# Patient Record
Sex: Female | Born: 1995 | Race: Black or African American | Hispanic: No | Marital: Single | Smoking: Never smoker
Health system: Southern US, Community
[De-identification: ages and names within clinical notes are randomized; demographics above are authoritative.]

## PROBLEM LIST (undated history)

## (undated) ENCOUNTER — Inpatient Hospital Stay (HOSPITAL_COMMUNITY): Payer: Self-pay

## (undated) DIAGNOSIS — Z789 Other specified health status: Secondary | ICD-10-CM

## (undated) HISTORY — PX: NO PAST SURGERIES: SHX2092

---

## 2018-06-08 ENCOUNTER — Encounter (HOSPITAL_COMMUNITY): Payer: Self-pay | Admitting: Emergency Medicine

## 2018-06-08 ENCOUNTER — Emergency Department (HOSPITAL_COMMUNITY)
Admission: EM | Admit: 2018-06-08 | Discharge: 2018-06-08 | Disposition: A | Discharge status: Home / Self Care | Payer: BLUE CROSS/BLUE SHIELD | Attending: Emergency Medicine | Admitting: Emergency Medicine

## 2018-06-08 ENCOUNTER — Other Ambulatory Visit: Payer: Self-pay

## 2018-06-08 DIAGNOSIS — R112 Nausea with vomiting, unspecified: Secondary | ICD-10-CM | POA: Diagnosis present

## 2018-06-08 DIAGNOSIS — O219 Vomiting of pregnancy, unspecified: Secondary | ICD-10-CM | POA: Diagnosis not present

## 2018-06-08 DIAGNOSIS — Z3A00 Weeks of gestation of pregnancy not specified: Secondary | ICD-10-CM | POA: Diagnosis not present

## 2018-06-08 DIAGNOSIS — Z331 Pregnant state, incidental: Secondary | ICD-10-CM

## 2018-06-08 LAB — COMPREHENSIVE METABOLIC PANEL
ALBUMIN: 4.2 g/dL (ref 3.5–5.0)
ALT: 19 U/L (ref 0–44)
ANION GAP: 9 (ref 5–15)
AST: 18 U/L (ref 15–41)
Alkaline Phosphatase: 64 U/L (ref 38–126)
BUN: 8 mg/dL (ref 6–20)
CO2: 25 mmol/L (ref 22–32)
Calcium: 9.5 mg/dL (ref 8.9–10.3)
Chloride: 104 mmol/L (ref 98–111)
Creatinine, Ser: 0.85 mg/dL (ref 0.44–1.00)
GFR calc non Af Amer: >60 mL/min (ref >60)
GLUCOSE: 91 mg/dL (ref 70–99)
POTASSIUM: 4 mmol/L (ref 3.5–5.1)
SODIUM: 138 mmol/L (ref 135–145)
TOTAL PROTEIN: 7.8 g/dL (ref 6.5–8.1)
Total Bilirubin: 0.5 mg/dL (ref 0.3–1.2)

## 2018-06-08 LAB — I-STAT BETA HCG BLOOD, ED (MC, WL, AP ONLY): I-stat hCG, quantitative: >2000 m[IU]/mL — ABNORMAL HIGH (ref <5)

## 2018-06-08 LAB — URINALYSIS, ROUTINE W REFLEX MICROSCOPIC
Bilirubin Urine: NEGATIVE
Glucose, UA: NEGATIVE mg/dL
Hgb urine dipstick: NEGATIVE
KETONES UR: 20 mg/dL — AB
LEUKOCYTES UA: NEGATIVE
Nitrite: NEGATIVE
PH: 6 (ref 5.0–8.0)
Protein, ur: NEGATIVE mg/dL
SPECIFIC GRAVITY, URINE: 1.024 (ref 1.005–1.030)

## 2018-06-08 LAB — CBC WITH DIFFERENTIAL/PLATELET
BASOS ABS: 0 10*3/uL (ref 0.0–0.1)
Basophils Relative: 0 %
Eosinophils Absolute: 0.1 10*3/uL (ref 0.0–0.7)
Eosinophils Relative: 0 %
HEMATOCRIT: 41.1 % (ref 36.0–46.0)
HEMOGLOBIN: 13.3 g/dL (ref 12.0–15.0)
Lymphocytes Relative: 18 %
Lymphs Abs: 2.1 10*3/uL (ref 0.7–4.0)
MCH: 26.9 pg (ref 26.0–34.0)
MCHC: 32.4 g/dL (ref 30.0–36.0)
MCV: 83.2 fL (ref 78.0–100.0)
MONO ABS: 0.5 10*3/uL (ref 0.1–1.0)
Monocytes Relative: 4 %
NEUTROS ABS: 8.9 10*3/uL — AB (ref 1.7–7.7)
NEUTROS PCT: 78 %
Platelets: 302 10*3/uL (ref 150–400)
RBC: 4.94 MIL/uL (ref 3.87–5.11)
RDW: 13.3 % (ref 11.5–15.5)
WBC: 11.6 10*3/uL — ABNORMAL HIGH (ref 4.0–10.5)

## 2018-06-08 LAB — LIPASE, BLOOD: LIPASE: 29 U/L (ref 11–51)

## 2018-06-08 MED ORDER — ONDANSETRON HCL 4 MG/2ML IJ SOLN
4.0000 mg | Freq: Once | INTRAMUSCULAR | Status: AC
  Administered 2018-06-08: 4 mg via INTRAVENOUS
  Filled 2018-06-08: qty 2

## 2018-06-08 MED ORDER — SODIUM CHLORIDE 0.9 % IV BOLUS
1000.0000 mL | Freq: Once | INTRAVENOUS | Status: AC
  Administered 2018-06-08: 1000 mL via INTRAVENOUS

## 2018-06-08 NOTE — ED Notes (Signed)
Pt given gingerale for PO fluid challenge. Pt reported to RN she also drank 16oz of apple juice that she has been able to hold down at this time.

## 2018-06-08 NOTE — ED Notes (Signed)
PT unable to provide urine sample at this time

## 2018-06-08 NOTE — ED Notes (Signed)
Pt aware urine sample needed 

## 2018-06-08 NOTE — ED Notes (Signed)
Pt currently tolerating PO fluids. No episodes of emesis since drinking the gingerale and apple juice

## 2018-06-08 NOTE — ED Triage Notes (Signed)
Pt c/o emesis x 1 week and states she has not been able to tolerate food or liquids for approximately 1 week.

## 2018-06-08 NOTE — ED Provider Notes (Signed)
[facility]-EMERGENCY DEPT Provider Note   CSN: [id number] Arrival date & time: 06/08/18  1014     History   Chief Complaint Chief Complaint  Patient presents with  . Emesis    HPI Stephanie Flowers is a 22 y.o. female.  22 year old female presents with complaint of nausea x1 week.  Patient states that she occasionally has one episode of emesis in the morning.  Denies abdominal pain, changes in bowel or bladder habits, vaginal bleeding or discharge, fevers, chills.  She states her last menstrual cycle was earlier in August, states it was lighter than usual, states she had some spotting in July.  No other complaints or concerns.     History reviewed. No pertinent past medical history.  There are no active problems to display for this patient.   History reviewed. No pertinent surgical history.   OB History   None      Home Medications    Prior to Admission medications   Not on File    Family History History reviewed. No pertinent family history.  Social History Social History   Tobacco Use  . Smoking status: Never Smoker  . Smokeless tobacco: Never Used  Substance Use Topics  . Alcohol use: Never    Frequency: Never  . Drug use: Never     Allergies   Patient has no known allergies.   Review of Systems Review of Systems  Constitutional: Negative for chills and fever.  Gastrointestinal: Positive for nausea and vomiting. Negative for abdominal distention, abdominal pain, blood in stool, constipation and diarrhea.  Genitourinary: Negative for dysuria, frequency, urgency, vaginal bleeding and vaginal discharge.  Musculoskeletal: Negative for back pain.  Skin: Negative for rash and wound.  Allergic/Immunologic: Negative for immunocompromised state.  Neurological: Negative for dizziness and weakness.  Hematological: Does not bruise/bleed easily.  Psychiatric/Behavioral: Negative for confusion.  All other systems reviewed and are  negative.    Physical Exam Updated Vital Signs BP 127/81 (BP Location: Left Arm)   Pulse 73   Temp 98.2 F (36.8 C) (Oral)   Resp 16   LMP 05/29/2018 (Exact Date)   SpO2 100%   Physical Exam  Constitutional: She is oriented to person, place, and time. She appears well-developed and well-nourished. No distress.  HENT:  Head: Normocephalic and atraumatic.  Cardiovascular: Normal rate, regular rhythm, normal heart sounds and intact distal pulses.  No murmur heard. Pulmonary/Chest: Effort normal and breath sounds normal. No respiratory distress.  Abdominal: Soft. Bowel sounds are normal. She exhibits no distension. There is no tenderness. There is no guarding.  Neurological: She is alert and oriented to person, place, and time.  Skin: Skin is warm and dry. She is not diaphoretic.  Psychiatric: She has a normal mood and affect. Her behavior is normal.  Nursing note and vitals reviewed.    ED Treatments / Results  Labs (all labs ordered are listed, but only abnormal results are displayed) Labs Reviewed  CBC WITH DIFFERENTIAL/PLATELET - Abnormal; Notable for the following components:      Result Value   WBC 11.6 (*)    Neutro Abs 8.9 (*)    All other components within normal limits  URINALYSIS, ROUTINE W REFLEX MICROSCOPIC - Abnormal; Notable for the following components:   Ketones, ur 20 (*)    All other components within normal limits  I-STAT BETA HCG BLOOD, ED ([facility], [facility], [facility] ONLY) - Abnormal; Notable for the following components:   I-stat hCG, quantitative >2,000.0 (*)  All other components within normal limits  COMPREHENSIVE METABOLIC PANEL  LIPASE, BLOOD    EKG None  Radiology No results found.  Procedures Procedures (including critical care time)  Medications Ordered in ED Medications  ondansetron (ZOFRAN) injection 4 mg (4 mg Intravenous Given 06/08/18 1134)  sodium chloride 0.9 % bolus 1,000 mL (0 mLs Intravenous Stopped 06/08/18 1235)     Initial  Impression / Assessment and Plan / ED Course  I have reviewed the triage vital signs and the nursing notes.  Pertinent labs & imaging results that were available during my care of the patient were reviewed by me and considered in my medical decision making (see chart for details).  Clinical Course as of Jun 08 1400  Sun Jun 08, 2018  [id number] 22 year old female presents with complaint of nausea x1 week with vomiting occasionally in the morning only.  Patient denies abdominal pain, changes in bowel or bladder habits, fevers or chills.  Patient was found to have a positive pregnancy test in the emergency room, states that her last few cycles have been lighter than usual.  She denies abdominal pain, vaginal bleeding or discharge.  Patient is G1, P0, no PCP, recently moved to this area a few months ago.  Lipase is normal, urinalysis with ketones, CMP unremarkable (normal liver function), CBC with mildly elevated white count at 11.6.  She is referred to follow-up with the health department to work on establishing prenatal care.  Advised to return to the emergency room or [facility] for any further concerns.   [LM]  1401 Tolerating p.o. prior to discharge.  Advised she can take Unisom and vitamin B 12 to help with her nausea and occasional vomiting   [LM]    Clinical Course User Index [LM] Jeannie FendMurphy, Japji Kok A, PA-C    Final Clinical Impressions(s) / ED Diagnoses   Final diagnoses:  Non-intractable vomiting with nausea, unspecified vomiting type  Pregnant state, incidental    ED Discharge Orders    None       Jeannie FendMurphy, Kerstyn Coryell A, PA-C 06/08/18 1402    Sabas SousBero, Michael M, MD 06/08/18 1406

## 2018-06-08 NOTE — Discharge Instructions (Addendum)
Eat small, frequent meals. Try ginger ale, for nausea. Also try eating a few crackers in the morning before getting out of bed.  Take Vitamin B12 and Unisom for nausea and vomiting. Follow up with health department to establish care for pregnancy.

## 2018-06-12 ENCOUNTER — Encounter (HOSPITAL_COMMUNITY): Payer: Self-pay | Admitting: Emergency Medicine

## 2018-06-12 ENCOUNTER — Emergency Department (HOSPITAL_COMMUNITY)
Admission: EM | Admit: 2018-06-12 | Discharge: 2018-06-13 | Disposition: A | Discharge status: Home / Self Care | Payer: BLUE CROSS/BLUE SHIELD | Attending: Emergency Medicine | Admitting: Emergency Medicine

## 2018-06-12 DIAGNOSIS — Z3A01 Less than 8 weeks gestation of pregnancy: Secondary | ICD-10-CM | POA: Insufficient documentation

## 2018-06-12 DIAGNOSIS — O210 Mild hyperemesis gravidarum: Secondary | ICD-10-CM | POA: Diagnosis not present

## 2018-06-12 LAB — CBC
HCT: 41.7 % (ref 36.0–46.0)
Hemoglobin: 13 g/dL (ref 12.0–15.0)
MCH: 26.6 pg (ref 26.0–34.0)
MCHC: 31.2 g/dL (ref 30.0–36.0)
MCV: 85.3 fL (ref 78.0–100.0)
Platelets: 315 10*3/uL (ref 150–400)
RBC: 4.89 MIL/uL (ref 3.87–5.11)
RDW: 13.1 % (ref 11.5–15.5)
WBC: 14.1 10*3/uL — ABNORMAL HIGH (ref 4.0–10.5)

## 2018-06-12 LAB — COMPREHENSIVE METABOLIC PANEL
ALK PHOS: 66 U/L (ref 38–126)
ALT: 22 U/L (ref 0–44)
AST: 20 U/L (ref 15–41)
Albumin: 4.1 g/dL (ref 3.5–5.0)
Anion gap: 11 (ref 5–15)
BILIRUBIN TOTAL: 0.7 mg/dL (ref 0.3–1.2)
BUN: 6 mg/dL (ref 6–20)
CALCIUM: 9.7 mg/dL (ref 8.9–10.3)
CO2: 24 mmol/L (ref 22–32)
Chloride: 101 mmol/L (ref 98–111)
Creatinine, Ser: 1 mg/dL (ref 0.44–1.00)
GFR calc Af Amer: >60 mL/min (ref >60)
Glucose, Bld: 102 mg/dL — ABNORMAL HIGH (ref 70–99)
Potassium: 3.8 mmol/L (ref 3.5–5.1)
Sodium: 136 mmol/L (ref 135–145)
Total Protein: 7.6 g/dL (ref 6.5–8.1)

## 2018-06-12 LAB — I-STAT BETA HCG BLOOD, ED (MC, WL, AP ONLY)

## 2018-06-12 MED ORDER — ONDANSETRON HCL 4 MG/2ML IJ SOLN
4.0000 mg | Freq: Once | INTRAMUSCULAR | Status: AC
  Administered 2018-06-13: 4 mg via INTRAVENOUS
  Filled 2018-06-12: qty 2

## 2018-06-12 MED ORDER — SODIUM CHLORIDE 0.9 % IV BOLUS (SEPSIS)
1000.0000 mL | Freq: Once | INTRAVENOUS | Status: AC
  Administered 2018-06-13: 1000 mL via INTRAVENOUS

## 2018-06-12 NOTE — ED Triage Notes (Signed)
Pt presents to ED for assessment after being diagnosed Monday with morning sickness for an unknown pregnancy.  Patient has been having emesis x 1.5 wks, and states today she noted blood ("three mouths full") when she cleared her throat after vomiting.  Denies pain, sts she has vomited 5 times today.  States its any time when she tries to eat.

## 2018-06-13 LAB — URINALYSIS, ROUTINE W REFLEX MICROSCOPIC
BACTERIA UA: NONE SEEN
BILIRUBIN URINE: NEGATIVE
Glucose, UA: NEGATIVE mg/dL
HGB URINE DIPSTICK: NEGATIVE
KETONES UR: 80 mg/dL — AB
LEUKOCYTES UA: NEGATIVE
NITRITE: NEGATIVE
Protein, ur: 30 mg/dL — AB
Specific Gravity, Urine: 1.038 — ABNORMAL HIGH (ref 1.005–1.030)
pH: 6 (ref 5.0–8.0)

## 2018-06-13 MED ORDER — PRENATAL VITAMINS 0.8 MG PO TABS: 1.0000 | ORAL_TABLET | Freq: Every day | ORAL | 0 refills | Status: AC

## 2018-06-13 MED ORDER — PROMETHAZINE HCL 25 MG PO TABS: 25.0000 mg | ORAL_TABLET | Freq: Three times a day (TID) | ORAL | 0 refills | Status: DC | PRN

## 2018-06-13 MED ORDER — ONDANSETRON 8 MG PO TBDP: ORAL_TABLET | ORAL | 0 refills | Status: DC

## 2018-06-13 NOTE — ED Notes (Signed)
Pt reminded that we need a urine sample. Stated that she could not provide one even after IV and PO fluids. Will continue to remind her.

## 2018-06-13 NOTE — Discharge Instructions (Signed)
You can take 1/2 tablet of the Unisom 3 times per day You can take 25 mg of vitamin B6 3 times per day Be sure to stay hydrated You can start Phenergan by mouth if the other medications do not work. If Phenergan does not work, then you can start the Zofran dissolving tablet. Phenergan and Zofran, and prenatal vitamins have been sent to your pharmacy

## 2018-06-13 NOTE — ED Provider Notes (Signed)
[facility] EMERGENCY DEPARTMENT Provider Note   CSN: [id number] Arrival date & time: 06/12/18  2129     History   Chief Complaint Chief Complaint  Patient presents with  . Hematemesis    HPI Stephanie Flowers is a 22 y.o. female.  The history is provided by the patient.  Emesis   This is a new problem. The current episode started more than 2 days ago. The problem occurs 2 to 4 times per day. The problem has been gradually worsening. The emesis has an appearance of stomach contents. There has been no fever. Associated symptoms include diarrhea. Pertinent negatives include no abdominal pain, no cough and no fever.   She reports about a week ago she began having vomiting, she thought she just had a virus.  She was seen in the emergency department on August 25 and diagnosed with hyperemesis gravidarum, she was found to have early pregnancy She Did not know she was pregnant.  LMP was approximately August 5 This is her first pregnancy.  She has had no abdominal surgeries. She was given instructions to use doxylamine/pyrodoxine, these medicines are not helping her nausea.  She reports tonight she had a forceful episode of vomiting and then had a small amount of blood in her mouth.  This is now improved.  Denies previous history of GI bleed.  No history of bloody or dark stool.  No abdominal pain, no vaginal bleeding. No Chest pain or shortness of breath PMH-none Soc hx - just quit smoking OB History   None      Home Medications    Prior to Admission medications   Not on File    Family History History reviewed. No pertinent family history.  Social History Social History   Tobacco Use  . Smoking status: Never Smoker  . Smokeless tobacco: Never Used  Substance Use Topics  . Alcohol use: Never    Frequency: Never  . Drug use: Never     Allergies   Patient has no known allergies.   Review of Systems Review of Systems  Constitutional: Negative for  fever.  HENT: Negative for nosebleeds.   Respiratory: Negative for cough and shortness of breath.   Cardiovascular: Negative for chest pain.  Gastrointestinal: Positive for diarrhea and vomiting. Negative for abdominal pain and blood in stool.  Genitourinary: Negative for vaginal bleeding and vaginal discharge.  All other systems reviewed and are negative.    Physical Exam Updated Vital Signs BP 115/71 (BP Location: Right Arm)   Pulse 72   Temp (!) 97.4 F (36.3 C) (Oral)   Resp 18   Ht 1.727 m (5\' 8" )   Wt 117.9 kg   LMP 05/29/2018 (Exact Date)   SpO2 100%   BMI 39.53 kg/m   Physical Exam CONSTITUTIONAL: Well developed/well nourished HEAD: Normocephalic/atraumatic EYES: EOMI/PERRL no icterus ENMT: Mucous membranes dry  uvula midline without any erythema or evidence of active bleed.  No blood in either nare. NECK: supple no meningeal signs, no crepitus to neck SPINE/BACK:entire spine nontender CV: S1/S2 noted, no murmurs/rubs/gallops noted LUNGS: Lungs are clear to auscultation bilaterally, no apparent distress ABDOMEN: soft, nontender, no rebound or guarding, bowel sounds noted throughout abdomen GU:no cva tenderness NEURO: Pt is awake/alert/appropriate, moves all extremitiesx4.  No facial droop.   EXTREMITIES: pulses normal/equal, full ROM SKIN: warm, color normal PSYCH: no abnormalities of mood noted, alert and oriented to situation   ED Treatments / Results  Labs (all labs ordered are listed, but only  abnormal results are displayed) Labs Reviewed  COMPREHENSIVE METABOLIC PANEL - Abnormal; Notable for the following components:      Result Value   Glucose, Bld 102 (*)    All other components within normal limits  CBC - Abnormal; Notable for the following components:   WBC 14.1 (*)    All other components within normal limits  URINALYSIS, ROUTINE W REFLEX MICROSCOPIC - Abnormal; Notable for the following components:   Color, Urine AMBER (*)    Specific Gravity,  Urine 1.038 (*)    Ketones, ur 80 (*)    Protein, ur 30 (*)    All other components within normal limits  I-STAT BETA HCG BLOOD, ED ([facility], [facility], [facility] ONLY) - Abnormal; Notable for the following components:   I-stat hCG, quantitative >2,000.0 (*)    All other components within normal limits    EKG None  Radiology No results found.  Procedures Procedures   Medications Ordered in ED Medications  sodium chloride 0.9 % bolus 1,000 mL (0 mLs Intravenous Stopped 06/13/18 0128)  ondansetron (ZOFRAN) injection 4 mg (4 mg Intravenous Given 06/13/18 0010)     Initial Impression / Assessment and Plan / ED Course  I have reviewed the triage vital signs and the nursing notes.  Pertinent labs  results that were available during my care of the patient were reviewed by me and considered in my medical decision making (see chart for details).     12:23 AM PT With probable hyperemesis.  Will start with IV fluids and Zofran.  Labs thus far reassuring.  I have low suspicion for acute GI bleed probable irritation from vomiting.  Will defer any imaging at this time as patient has no abdominal pain and no vaginal bleeding   She is improved in the ER.  She is taking p.o. fluids.  Urinalysis consistent with dehydration.  She denies any abdominal pain. We discussed at length proper use of doxylamine and pyridoxine If this does not work on a consistent basis, Phenergan and Zofran have both been sent to her pharmacy.  Told her to be sure she is staying hydrated.  She already has follow-up with OB/GYN planned.  Strict return precautions Final Clinical Impressions(s) / ED Diagnoses   Final diagnoses:  Hyperemesis gravidarum    ED Discharge Orders         Ordered    promethazine (PHENERGAN) 25 MG tablet  Every 8 hours PRN     06/13/18 0125    ondansetron (ZOFRAN ODT) 8 MG disintegrating tablet     06/13/18 0125    Prenatal Multivit-Min-Fe-FA (PRENATAL VITAMINS) 0.8 MG tablet  Daily     06/13/18 0125             Zadie RhineWickline, Dylin Breeden, MD 06/13/18 [phone]

## 2018-06-13 NOTE — ED Notes (Signed)
Pt refusing to provide urine sample, prompted to try. Drinking Dr. Reino KentPepper in room, passed PO challenge.

## 2018-06-13 NOTE — ED Notes (Signed)
Pt given ice water after stating that she could not drink her personal Dr. Reino KentPepper.

## 2018-06-16 ENCOUNTER — Other Ambulatory Visit: Payer: Self-pay

## 2018-06-16 ENCOUNTER — Emergency Department (HOSPITAL_COMMUNITY)
Admission: EM | Admit: 2018-06-16 | Discharge: 2018-06-16 | Disposition: A | Discharge status: Home / Self Care | Payer: BLUE CROSS/BLUE SHIELD | Attending: Emergency Medicine | Admitting: Emergency Medicine

## 2018-06-16 ENCOUNTER — Encounter (HOSPITAL_COMMUNITY): Payer: Self-pay | Admitting: Emergency Medicine

## 2018-06-16 DIAGNOSIS — Z3A01 Less than 8 weeks gestation of pregnancy: Secondary | ICD-10-CM | POA: Insufficient documentation

## 2018-06-16 DIAGNOSIS — O218 Other vomiting complicating pregnancy: Secondary | ICD-10-CM | POA: Diagnosis present

## 2018-06-16 DIAGNOSIS — O219 Vomiting of pregnancy, unspecified: Secondary | ICD-10-CM

## 2018-06-16 LAB — CBC WITH DIFFERENTIAL/PLATELET
BASOS ABS: 0 10*3/uL (ref 0.0–0.1)
Basophils Relative: 0 %
EOS PCT: 0 %
Eosinophils Absolute: 0 10*3/uL (ref 0.0–0.7)
HCT: 41.3 % (ref 36.0–46.0)
Hemoglobin: 13.7 g/dL (ref 12.0–15.0)
Lymphocytes Relative: 10 %
Lymphs Abs: 1.4 10*3/uL (ref 0.7–4.0)
MCH: 27.2 pg (ref 26.0–34.0)
MCHC: 33.2 g/dL (ref 30.0–36.0)
MCV: 81.9 fL (ref 78.0–100.0)
Monocytes Absolute: 0.6 10*3/uL (ref 0.1–1.0)
Monocytes Relative: 4 %
NEUTROS ABS: 12.3 10*3/uL — AB (ref 1.7–7.7)
NEUTROS PCT: 86 %
PLATELETS: 316 10*3/uL (ref 150–400)
RBC: 5.04 MIL/uL (ref 3.87–5.11)
RDW: 13.2 % (ref 11.5–15.5)
WBC: 14.3 10*3/uL — ABNORMAL HIGH (ref 4.0–10.5)

## 2018-06-16 LAB — COMPREHENSIVE METABOLIC PANEL
ALBUMIN: 4.3 g/dL (ref 3.5–5.0)
ALK PHOS: 72 U/L (ref 38–126)
ALT: 27 U/L (ref 0–44)
ANION GAP: 15 (ref 5–15)
AST: 23 U/L (ref 15–41)
BUN: 9 mg/dL (ref 6–20)
CALCIUM: 9.6 mg/dL (ref 8.9–10.3)
CHLORIDE: 101 mmol/L (ref 98–111)
CO2: 22 mmol/L (ref 22–32)
Creatinine, Ser: 0.87 mg/dL (ref 0.44–1.00)
GFR calc Af Amer: >60 mL/min (ref >60)
GFR calc non Af Amer: >60 mL/min (ref >60)
GLUCOSE: 86 mg/dL (ref 70–99)
Potassium: 4.3 mmol/L (ref 3.5–5.1)
SODIUM: 138 mmol/L (ref 135–145)
Total Bilirubin: 0.6 mg/dL (ref 0.3–1.2)
Total Protein: 8.2 g/dL — ABNORMAL HIGH (ref 6.5–8.1)

## 2018-06-16 LAB — URINALYSIS, ROUTINE W REFLEX MICROSCOPIC
BILIRUBIN URINE: NEGATIVE
Bacteria, UA: NONE SEEN
Glucose, UA: NEGATIVE mg/dL
Hgb urine dipstick: NEGATIVE
Ketones, ur: 80 mg/dL — AB
Leukocytes, UA: NEGATIVE
Nitrite: NEGATIVE
PROTEIN: 100 mg/dL — AB
Specific Gravity, Urine: 1.035 — ABNORMAL HIGH (ref 1.005–1.030)
pH: 6 (ref 5.0–8.0)

## 2018-06-16 LAB — HCG, QUANTITATIVE, PREGNANCY: HCG, BETA CHAIN, QUANT, S: 149097 m[IU]/mL — AB (ref <5)

## 2018-06-16 MED ORDER — PROMETHAZINE HCL 25 MG RE SUPP: 25.0000 mg | Freq: Four times a day (QID) | RECTAL | 0 refills | Status: DC | PRN

## 2018-06-16 MED ORDER — SODIUM CHLORIDE 0.9 % IV BOLUS
1000.0000 mL | Freq: Once | INTRAVENOUS | Status: AC
  Administered 2018-06-16: 1000 mL via INTRAVENOUS

## 2018-06-16 MED ORDER — METOCLOPRAMIDE HCL 5 MG/ML IJ SOLN
10.0000 mg | Freq: Once | INTRAMUSCULAR | Status: AC
  Administered 2018-06-16: 10 mg via INTRAVENOUS
  Filled 2018-06-16: qty 2

## 2018-06-16 NOTE — ED Notes (Signed)
Patient verbalized understanding of discharge instructions, no questions. Patient ambulated out of ED with steady gait in no distress.  

## 2018-06-16 NOTE — ED Triage Notes (Signed)
Pt complaint of continued emesis since Thursday; LMP in July unknown specific date.

## 2018-06-16 NOTE — ED Provider Notes (Signed)
[facility]-EMERGENCY DEPT Provider Note   CSN: [id number] Arrival date & time: 06/16/18  0911     History   Chief Complaint Chief Complaint  Patient presents with  . Emesis During Pregnancy    HPI Stephanie Flowers is a 22 y.o. female.  HPI   Stephanie Flowers is a 22 y.o. female, with a history of G1P0, presenting to the ED with nausea and vomiting beginning August 23.  Endorse 5-6 episodes of nonbilious, nonbloody emesis per day.   She has tried water, electrolyte drinks, soup, crackers, etc., but vomits regardless. She will vomit even if she has not had food/drink.   She was noted to be pregnant during her ED visit August 25. She was told to take combination of B vitamin and Unisom.  She was seen again for N/V on August 29. She was prescribed phenergan and zofran. She endorses continued vomiting despite these medications.   She thinks her LMP was "sometime in the middle of July." She has called the health department and they gave her an appointment in October.   Last BM was 8/28, which she states is normal for her.   Denies abdominal pain, abnormal vaginal discharge, vaginal bleeding, fever/chills, urinary symptoms, diarrhea, or any other complaints.      History reviewed. No pertinent past medical history.  There are no active problems to display for this patient.   History reviewed. No pertinent surgical history.   OB History   None      Home Medications    Prior to Admission medications   Medication Sig Start Date End Date Taking? Authorizing Provider  Prenatal Multivit-Min-Fe-FA (PRENATAL VITAMINS) 0.8 MG tablet Take 1 tablet by mouth daily. 06/13/18  Yes Zadie Rhine, MD  promethazine (PHENERGAN) 25 MG tablet Take 1 tablet (25 mg total) by mouth every 8 (eight) hours as needed for nausea or vomiting. 06/13/18  Yes Zadie Rhine, MD  promethazine (PHENERGAN) 25 MG suppository Place 1 suppository (25 mg total) rectally every 6 (six)  hours as needed for nausea or vomiting. 06/16/18   Joy, Hillard Danker, PA-C    Family History No family history on file.  Social History Social History   Tobacco Use  . Smoking status: Never Smoker  . Smokeless tobacco: Never Used  Substance Use Topics  . Alcohol use: Never    Frequency: Never  . Drug use: Never     Allergies   Patient has no known allergies.   Review of Systems Review of Systems  Constitutional: Negative for chills, diaphoresis and fever.  Respiratory: Negative for cough and shortness of breath.   Cardiovascular: Negative for chest pain.  Gastrointestinal: Positive for nausea and vomiting. Negative for abdominal pain, blood in stool and diarrhea.  Genitourinary: Negative for dysuria, hematuria, vaginal bleeding and vaginal discharge.  Musculoskeletal: Negative for back pain.  All other systems reviewed and are negative.    Physical Exam Updated Vital Signs BP 123/81 (BP Location: Left Arm)   Pulse 79   Temp (!) 97.5 F (36.4 C) (Oral)   Resp 15   LMP 04/14/2018 (LMP Unknown) Comment: July 2019 LMP  SpO2 100%   Physical Exam  Constitutional: She appears well-developed and well-nourished. No distress.  HENT:  Head: Normocephalic and atraumatic.  Eyes: Conjunctivae are normal.  Neck: Neck supple.  Cardiovascular: Normal rate, regular rhythm, normal heart sounds and intact distal pulses.  Pulmonary/Chest: Effort normal and breath sounds normal. No respiratory distress.  Abdominal: Soft. There is no tenderness. There  is no guarding.  Musculoskeletal: She exhibits no edema.  Lymphadenopathy:    She has no cervical adenopathy.  Neurological: She is alert.  Skin: Skin is warm and dry. She is not diaphoretic.  Psychiatric: She has a normal mood and affect. Her behavior is normal.  Nursing note and vitals reviewed.    ED Treatments / Results  Labs (all labs ordered are listed, but only abnormal results are displayed) Labs Reviewed  URINALYSIS,  ROUTINE W REFLEX MICROSCOPIC - Abnormal; Notable for the following components:      Result Value   Specific Gravity, Urine 1.035 (*)    Ketones, ur 80 (*)    Protein, ur 100 (*)    All other components within normal limits  COMPREHENSIVE METABOLIC PANEL - Abnormal; Notable for the following components:   Total Protein 8.2 (*)    All other components within normal limits  CBC WITH DIFFERENTIAL/PLATELET - Abnormal; Notable for the following components:   WBC 14.3 (*)    Neutro Abs 12.3 (*)    All other components within normal limits  HCG, QUANTITATIVE, PREGNANCY - Abnormal; Notable for the following components:   hCG, Beta Chain, Quant, S 149,097 (*)    All other components within normal limits    EKG None  Radiology No results found.  Procedures Procedures (including critical care time)  Medications Ordered in ED Medications  sodium chloride 0.9 % bolus 1,000 mL (0 mLs Intravenous Stopped 06/16/18 1234)  metoCLOPramide (REGLAN) injection 10 mg (10 mg Intravenous Given 06/16/18 1002)     Initial Impression / Assessment and Plan / ED Course  I have reviewed the triage vital signs and the nursing notes.  Pertinent labs & imaging results that were available during my care of the patient were reviewed by me and considered in my medical decision making (see chart for details).  Clinical Course as of Jun 16 1605  Mon Jun 16, 2018  1040 Bedside US attempted with what appears to be an early IUP.  Suspect it is too early in the pregnancy to definitively visualize this pregnancy but via the bedside transabdominal method. Patient states her nausea has improved.  Requesting something to drink.   [SJ]    Clinical Course User Index [SJ] Joy, Shawn C, PA-C    Patient presents with persistent vomiting in the setting of pregnancy.  By her estimated LMP, patient is around [redacted] weeks pregnant.   Patient is nontoxic appearing, afebrile, not tachycardic, not tachypneic, not hypotensive,  maintains excellent SPO2 on room air, and is in no apparent distress.  She has no complaints of vaginal bleeding or abdominal pain and her abdominal exam is benign.  She had no vomiting during ED course.  Ketonuria without hyperglycemia was addressed with IV fluids.  We will add the option of Phenergan suppositories to the patient's regimen.  She has been instructed to follow-up with the women's outpatient clinic for closer management of this issue. The patient was given instructions for home care as well as return precautions. Patient voices understanding of these instructions, accepts the plan, and is comfortable with discharge.  Findings and plan of care discussed with Margarita Grizzle, MD. Dr. Rosalia Hammers personally evaluated and examined this patient.  Final Clinical Impressions(s) / ED Diagnoses   Final diagnoses:  Nausea and vomiting during pregnancy prior to [redacted] weeks gestation    ED Discharge Orders         Ordered    promethazine (PHENERGAN) 25 MG suppository  Every 6 hours  PRN     06/16/18 1215           Anselm Pancoast, PA-C 06/16/18 1610    Margarita Grizzle, MD 06/17/18 [phone]

## 2018-06-16 NOTE — Discharge Instructions (Addendum)
Try taking the B6 and Unisom first.  If this does not work, try using the Zofran dissolvable tablets.  If this still does not work, try using the Phenergan suppositories.  Do not take the Phenergan tablets and Phenergan suppositories at the same time.  Dehydration  We suspect you may be dehydrated.  Symptoms of any other illness will be intensified and complicated by dehydration. Dehydration can also extend the duration of symptoms. Dehydration typically causes its own symptoms including lightheadedness, nausea, headaches, fatigue increased thirst, and generally feeling unwell. Drink plenty of fluids and get plenty of rest. You should be drinking at least half a liter of water every hour or two to stay hydrated. Electrolyte drinks (ex. Gatorade, Powerade, Pedialyte) are also encouraged. You should be drinking enough fluids to make your urine light yellow, almost clear. If this is not the case, you are not drinking enough water.  Follow-up with OB/GYN on this matter.  You may try to follow-up with the women's clinic.  Should symptoms worsen or you continue to vomit, please proceed to the emergency department at [facility].

## 2018-06-16 NOTE — ED Notes (Signed)
Patient able to tolerate sprite and water. States nausea has improved.

## 2018-06-17 ENCOUNTER — Encounter (HOSPITAL_COMMUNITY): Payer: Self-pay

## 2018-06-17 ENCOUNTER — Inpatient Hospital Stay (HOSPITAL_COMMUNITY)
Admission: AD | Admit: 2018-06-17 | Discharge: 2018-06-17 | Disposition: A | Discharge status: Home / Self Care | Payer: BLUE CROSS/BLUE SHIELD | Source: Ambulatory Visit | Attending: Obstetrics and Gynecology | Admitting: Obstetrics and Gynecology

## 2018-06-17 DIAGNOSIS — O219 Vomiting of pregnancy, unspecified: Secondary | ICD-10-CM

## 2018-06-17 DIAGNOSIS — Z3A09 9 weeks gestation of pregnancy: Secondary | ICD-10-CM | POA: Insufficient documentation

## 2018-06-17 DIAGNOSIS — R112 Nausea with vomiting, unspecified: Secondary | ICD-10-CM | POA: Diagnosis present

## 2018-06-17 DIAGNOSIS — O26891 Other specified pregnancy related conditions, first trimester: Secondary | ICD-10-CM | POA: Diagnosis present

## 2018-06-17 HISTORY — DX: Other specified health status: Z78.9

## 2018-06-17 MED ORDER — RANITIDINE HCL 150 MG PO TABS: 150.0000 mg | ORAL_TABLET | Freq: Two times a day (BID) | ORAL | 0 refills | Status: DC

## 2018-06-17 MED ORDER — LACTATED RINGERS IV BOLUS
1000.0000 mL | Freq: Once | INTRAVENOUS | Status: AC
  Administered 2018-06-17: 1000 mL via INTRAVENOUS

## 2018-06-17 MED ORDER — PROMETHAZINE HCL 25 MG/ML IJ SOLN
25.0000 mg | Freq: Once | INTRAMUSCULAR | Status: AC
  Administered 2018-06-17: 25 mg via INTRAVENOUS
  Filled 2018-06-17: qty 1

## 2018-06-17 NOTE — MAU Provider Note (Signed)
History     CSN: [id number]  Arrival date and time: 06/17/18 0435   First Provider Initiated Contact with Patient 06/17/18 [phone]      Chief Complaint  Patient presents with  . Nausea  . Emesis   HPI Stephanie Flowers is a 22 y.o. G1P0 at [redacted]w[redacted]d who presents with nausea and vomiting. She states she has been vomiting since she left the emergency room yesterday. She states she took phenergan one time since then but threw up after eating yogurt and hasn't tried any more medication. She was prescribed phenergan suppositories but could not pick them up because it was labor day. She denies any vaginal bleeding or pain.   OB History    Gravida  1   Para      Term      Preterm      AB      Living        SAB      TAB      Ectopic      Multiple      Live Births              Past Medical History:  Diagnosis Date  . Medical history non-contributory     Past Surgical History:  Procedure Laterality Date  . NO PAST SURGERIES      Family History  Problem Relation Age of Onset  . Diabetes Maternal Aunt   . Hypertension Maternal Grandmother     Social History   Tobacco Use  . Smoking status: Never Smoker  . Smokeless tobacco: Never Used  Substance Use Topics  . Alcohol use: Never    Frequency: Never  . Drug use: Never    Allergies: No Known Allergies  Medications Prior to Admission  Medication Sig Dispense Refill Last Dose  . Prenatal Multivit-Min-Fe-FA (PRENATAL VITAMINS) 0.8 MG tablet Take 1 tablet by mouth daily. 30 tablet 0 06/16/2018 at Unknown time  . promethazine (PHENERGAN) 25 MG tablet Take 1 tablet (25 mg total) by mouth every 8 (eight) hours as needed for nausea or vomiting. 30 tablet 0 06/16/2018 at Unknown time  . promethazine (PHENERGAN) 25 MG suppository Place 1 suppository (25 mg total) rectally every 6 (six) hours as needed for nausea or vomiting. 12 each 0     Review of Systems  Constitutional: Negative.  Negative for fatigue and fever.  HENT:  Negative.   Respiratory: Negative.  Negative for shortness of breath.   Cardiovascular: Negative.  Negative for chest pain.  Gastrointestinal: Positive for nausea and vomiting. Negative for abdominal pain, constipation and diarrhea.  Genitourinary: Negative.  Negative for dysuria, vaginal bleeding and vaginal discharge.  Neurological: Negative.  Negative for dizziness and headaches.   Physical Exam   Blood pressure 116/79, pulse 75, temperature 97.7 F (36.5 C), temperature source Oral, resp. rate 16, height 5\' 8"  (1.727 m), weight 112.9 kg, last menstrual period 04/14/2018, SpO2 100 %.  Physical Exam  Nursing note and vitals reviewed. Constitutional: She is oriented to person, place, and time. She appears well-developed and well-nourished. No distress.  HENT:  Head: Normocephalic.  Eyes: Pupils are equal, round, and reactive to light.  Cardiovascular: Normal rate, regular rhythm and normal heart sounds.  Respiratory: Effort normal and breath sounds normal. No respiratory distress.  GI: Soft. Bowel sounds are normal. She exhibits no distension. There is no tenderness.  Neurological: She is alert and oriented to person, place, and time.  Skin: Skin is warm and dry.  Psychiatric: She  has a normal mood and affect. Her behavior is normal. Judgment and thought content normal.    MAU Course  Procedures  MDM UA- patient unable to leave sample LR bolus Phenergan IV No episodes of vomiting while in MAU  Assessment and Plan   1. Nausea and vomiting during pregnancy prior to [redacted] weeks gestation   2. [redacted] weeks gestation of pregnancy    -Discharge home in stable condition -Rx for ranitidine given to patient. Encouraged patient to pick up previously prescribed antiemetics -Nausea and vomiting precautions discussed -Patient advised to follow-up with OB of choice to start prenatal care -Patient may return to MAU as needed or if her condition were to change or worsen  Rolm Bookbinder  CNM 06/17/2018, 5:06 AM

## 2018-06-17 NOTE — MAU Note (Signed)
Pt here with c/o nausea and vomiting. "Can't keep anything down." 

## 2018-06-17 NOTE — Discharge Instructions (Signed)
Morning Sickness °Morning sickness is when you feel sick to your stomach (nauseous) during pregnancy. This nauseous feeling may or may not come with vomiting. It often occurs in the morning but can be a problem any time of day. Morning sickness is most common during the first trimester, but it may continue throughout pregnancy. While morning sickness is unpleasant, it is usually harmless unless you develop severe and continual vomiting (hyperemesis gravidarum). This condition requires more intense treatment. °What are the causes? °The cause of morning sickness is not completely known but seems to be related to normal hormonal changes that occur in pregnancy. °What increases the risk? °You are at greater risk if you: °· Experienced nausea or vomiting before your pregnancy. °· Had morning sickness during a previous pregnancy. °· Are pregnant with more than one baby, such as twins. ° °How is this treated? °Do not use any medicines (prescription, over-the-counter, or herbal) for morning sickness without first talking to your health care provider. Your health care provider may prescribe or recommend: °· Vitamin B6 supplements. °· Anti-nausea medicines. °· The herbal medicine ginger. ° °Follow these instructions at home: °· Only take over-the-counter or prescription medicines as directed by your health care provider. °· Taking multivitamins before getting pregnant can prevent or decrease the severity of morning sickness in most women. °· Eat a piece of dry toast or unsalted crackers before getting out of bed in the morning. °· Eat five or six small meals a day. °· Eat dry and bland foods (rice, baked potato). Foods high in carbohydrates are often helpful. °· Do not drink liquids with your meals. Drink liquids between meals. °· Avoid greasy, fatty, and spicy foods. °· Get someone to cook for you if the smell of any food causes nausea and vomiting. °· If you feel nauseous after taking prenatal vitamins, take the vitamins at  night or with a snack. °· Snack on protein foods (nuts, yogurt, cheese) between meals if you are hungry. °· Eat unsweetened gelatins for desserts. °· Wearing an acupressure wristband (worn for sea sickness) may be helpful. °· Acupuncture may be helpful. °· Do not smoke. °· Get a humidifier to keep the air in your house free of odors. °· Get plenty of fresh air. °Contact a health care provider if: °· Your home remedies are not working, and you need medicine. °· You feel dizzy or lightheaded. °· You are losing weight. °Get help right away if: °· You have persistent and uncontrolled nausea and vomiting. °· You pass out (faint). °This information is not intended to replace advice given to you by your health care provider. Make sure you discuss any questions you have with your health care provider. °Document Released: 11/22/2006 Document Revised: 03/08/2016 Document Reviewed: 03/18/2013 °Elsevier Interactive Patient Education © 2017 [employer] °Safe Medications in Pregnancy  ° °Acne: °Benzoyl Peroxide °Salicylic Acid ° °Backache/Headache: °Tylenol: 2 regular strength every 4 hours OR °             2 Extra strength every 6 hours ° °Colds/Coughs/Allergies: °Benadryl (alcohol free) 25 mg every 6 hours as needed °Breath right strips °Claritin °Cepacol throat lozenges °Chloraseptic throat spray °Cold-Eeze- up to three times per day °Cough drops, alcohol free °Flonase (by prescription only) °Guaifenesin °Mucinex °Robitussin DM (plain only, alcohol free) °Saline nasal spray/drops °Sudafed (pseudoephedrine) & Actifed ** use only after [redacted] weeks gestation and if you do not have high blood pressure °Tylenol °Vicks Vaporub °Zinc lozenges °Zyrtec  ° °Constipation: °Colace °Ducolax suppositories °Fleet enema °  Glycerin suppositories °Metamucil °Milk of magnesia °Miralax °Senokot °Smooth move tea ° °Diarrhea: °Kaopectate °Imodium A-D ° °*NO pepto Bismol ° °Hemorrhoids: °Anusol °Anusol HC °Preparation  H °Tucks ° °Indigestion: °Tums °Maalox °Mylanta °Zantac  °Pepcid ° °Insomnia: °Benadryl (alcohol free) 25mg every 6 hours as needed °Tylenol PM °Unisom, no Gelcaps ° °Leg Cramps: °Tums °MagGel ° °Nausea/Vomiting:  °Bonine °Dramamine °Emetrol °Ginger extract °Sea bands °Meclizine  °Nausea medication to take during pregnancy:  °Unisom (doxylamine succinate 25 mg tablets) Take one tablet daily at bedtime. If symptoms are not adequately controlled, the dose can be increased to a maximum recommended dose of two tablets daily (1/2 tablet in the morning, 1/2 tablet mid-afternoon and one at bedtime). °Vitamin B6 100mg tablets. Take one tablet twice a day (up to 200 mg per day). ° °Skin Rashes: °Aveeno products °Benadryl cream or 25mg every 6 hours as needed °Calamine Lotion °1% cortisone cream ° °Yeast infection: °Gyne-lotrimin 7 °Monistat 7 ° ° °**If taking multiple medications, please check labels to avoid duplicating the same active ingredients °**take medication as directed on the label °** Do not exceed 4000 mg of tylenol in 24 hours °**Do not take medications that contain aspirin or ibuprofen ° ° ° ° °

## 2018-06-18 ENCOUNTER — Encounter (HOSPITAL_COMMUNITY): Payer: Self-pay | Admitting: *Deleted

## 2018-06-18 ENCOUNTER — Inpatient Hospital Stay (HOSPITAL_COMMUNITY)
Admission: AD | Admit: 2018-06-18 | Discharge: 2018-06-18 | Disposition: A | Discharge status: Home / Self Care | Payer: BLUE CROSS/BLUE SHIELD | Source: Ambulatory Visit | Attending: Obstetrics and Gynecology | Admitting: Obstetrics and Gynecology

## 2018-06-18 ENCOUNTER — Inpatient Hospital Stay (HOSPITAL_COMMUNITY): Payer: BLUE CROSS/BLUE SHIELD

## 2018-06-18 DIAGNOSIS — Z3490 Encounter for supervision of normal pregnancy, unspecified, unspecified trimester: Secondary | ICD-10-CM

## 2018-06-18 DIAGNOSIS — O9989 Other specified diseases and conditions complicating pregnancy, childbirth and the puerperium: Secondary | ICD-10-CM

## 2018-06-18 DIAGNOSIS — O23591 Infection of other part of genital tract in pregnancy, first trimester: Secondary | ICD-10-CM | POA: Insufficient documentation

## 2018-06-18 DIAGNOSIS — Z79899 Other long term (current) drug therapy: Secondary | ICD-10-CM | POA: Insufficient documentation

## 2018-06-18 DIAGNOSIS — N760 Acute vaginitis: Secondary | ICD-10-CM | POA: Diagnosis not present

## 2018-06-18 DIAGNOSIS — O26891 Other specified pregnancy related conditions, first trimester: Secondary | ICD-10-CM | POA: Diagnosis not present

## 2018-06-18 DIAGNOSIS — O4691 Antepartum hemorrhage, unspecified, first trimester: Secondary | ICD-10-CM | POA: Insufficient documentation

## 2018-06-18 DIAGNOSIS — B9689 Other specified bacterial agents as the cause of diseases classified elsewhere: Secondary | ICD-10-CM | POA: Diagnosis not present

## 2018-06-18 DIAGNOSIS — R109 Unspecified abdominal pain: Secondary | ICD-10-CM | POA: Diagnosis not present

## 2018-06-18 DIAGNOSIS — Z3A09 9 weeks gestation of pregnancy: Secondary | ICD-10-CM | POA: Diagnosis not present

## 2018-06-18 DIAGNOSIS — K117 Disturbances of salivary secretion: Secondary | ICD-10-CM | POA: Diagnosis present

## 2018-06-18 DIAGNOSIS — O209 Hemorrhage in early pregnancy, unspecified: Secondary | ICD-10-CM | POA: Diagnosis present

## 2018-06-18 LAB — URINALYSIS, ROUTINE W REFLEX MICROSCOPIC
Bacteria, UA: NONE SEEN
Bilirubin Urine: NEGATIVE
Glucose, UA: NEGATIVE mg/dL
Ketones, ur: 80 mg/dL — AB
Leukocytes, UA: NEGATIVE
Nitrite: NEGATIVE
Protein, ur: NEGATIVE mg/dL
Specific Gravity, Urine: 1.029 (ref 1.005–1.030)
pH: 6 (ref 5.0–8.0)

## 2018-06-18 LAB — WET PREP, GENITAL
Sperm: NONE SEEN
TRICH WET PREP: NONE SEEN
Yeast Wet Prep HPF POC: NONE SEEN

## 2018-06-18 LAB — ABO/RH: ABO/RH(D): A POS

## 2018-06-18 LAB — HCG, QUANTITATIVE, PREGNANCY: hCG, Beta Chain, Quant, S: 127804 m[IU]/mL — ABNORMAL HIGH (ref <5)

## 2018-06-18 MED ORDER — GLYCOPYRROLATE 1 MG PO TABS: 1.0000 mg | ORAL_TABLET | Freq: Three times a day (TID) | ORAL | 1 refills | Status: AC

## 2018-06-18 MED ORDER — METRONIDAZOLE 500 MG PO TABS: 500.0000 mg | ORAL_TABLET | Freq: Two times a day (BID) | ORAL | 0 refills | Status: AC

## 2018-06-18 NOTE — MAU Provider Note (Signed)
History     CSN: [id number]  Arrival date and time: 06/18/18 1031   First Provider Initiated Contact with Patient 06/18/18 1118      Chief Complaint  Patient presents with  . Vaginal Bleeding  . Abdominal Pain   HPI  Ms.  Stephanie Flowers is a 22 y.o. year old G1P0 female at [redacted]w[redacted]d weeks gestation who presents to MAU reporting BRB that she noticed when she awakened this AM. She also reports some abdominal pain in the lower part of her abdomen and RT shoulder since this AM. She states that she had vaginal spotting yesterday as well. When she called the RN phone line yesterday to report the vaginal spotting, she was told "not to worry about the spotting, if it was a small amount; it was probably normal. Vaginal spotting is normal up until 20 wks."  Past Medical History:  Diagnosis Date  . Medical history non-contributory     Past Surgical History:  Procedure Laterality Date  . NO PAST SURGERIES      Family History  Problem Relation Age of Onset  . Diabetes Maternal Aunt   . Hypertension Maternal Grandmother     Social History   Tobacco Use  . Smoking status: Never Smoker  . Smokeless tobacco: Never Used  Substance Use Topics  . Alcohol use: Never    Frequency: Never  . Drug use: Never    Allergies: No Known Allergies  Medications Prior to Admission  Medication Sig Dispense Refill Last Dose  . Prenatal Multivit-Min-Fe-FA (PRENATAL VITAMINS) 0.8 MG tablet Take 1 tablet by mouth daily. 30 tablet 0 06/16/2018 at Unknown time  . promethazine (PHENERGAN) 25 MG suppository Place 1 suppository (25 mg total) rectally every 6 (six) hours as needed for nausea or vomiting. 12 each 0   . promethazine (PHENERGAN) 25 MG tablet Take 1 tablet (25 mg total) by mouth every 8 (eight) hours as needed for nausea or vomiting. 30 tablet 0 06/16/2018 at Unknown time  . ranitidine (ZANTAC) 150 MG tablet Take 1 tablet (150 mg total) by mouth 2 (two) times daily. 60 tablet 0     Review of Systems   Constitutional: Negative.   HENT: Negative.   Eyes: Negative.   Respiratory: Negative.   Cardiovascular: Negative.   Gastrointestinal: Positive for abdominal pain.  Endocrine: Negative.   Genitourinary: Positive for pelvic pain (RT lower pelvic pain) and vaginal bleeding.  Musculoskeletal:       RT shoulder pain since this AM  Skin: Negative.   Allergic/Immunologic: Negative.   Neurological: Negative.   Hematological: Negative.   Psychiatric/Behavioral: Negative.    Physical Exam   Blood pressure 125/75, pulse 78, temperature (!) 97.4 F (36.3 C), resp. rate 16, height 5\' 8"  (1.727 m), weight 113.4 kg, last menstrual period 04/14/2018, SpO2 100 %.  Physical Exam  Nursing note and vitals reviewed. Constitutional: She is oriented to person, place, and time. She appears well-developed and well-nourished.  HENT:  Head: Normocephalic and atraumatic.  Eyes: Pupils are equal, round, and reactive to light.  Neck: Normal range of motion.  Cardiovascular: Normal rate, regular rhythm and normal heart sounds.  Respiratory: Effort normal and breath sounds normal.  GI: Soft. Bowel sounds are decreased.  Genitourinary:  Genitourinary Comments: Uterus: tender, SE: cervix is smooth, pink, no lesions, small amt of bright, red blood in vaginal vault and coming from cervical os -- WP, GC/CT done, closed/long/firm, no CMT or friability, exquisite RT adnexal tenderness   Musculoskeletal: Normal range of  motion.  Neurological: She is alert and oriented to person, place, and time.  Skin: Skin is warm and dry.  Psychiatric: She has a normal mood and affect. Her behavior is normal. Judgment and thought content normal.    MAU Course  Procedures  MDM CCUA Wet Prep GC/CT -- pending  HCG OB U/S < 14 wks   Results for orders placed or performed during the hospital encounter of 06/18/18 (from the past 24 hour(s))  Urinalysis, Routine w reflex microscopic     Status: Abnormal   Collection Time:  06/18/18 11:24 AM  Result Value Ref Range   Color, Urine YELLOW YELLOW   APPearance HAZY (A) CLEAR   Specific Gravity, Urine 1.029 1.005 - 1.030   pH 6.0 5.0 - 8.0   Glucose, UA NEGATIVE NEGATIVE mg/dL   Hgb urine dipstick LARGE (A) NEGATIVE   Bilirubin Urine NEGATIVE NEGATIVE   Ketones, ur 80 (A) NEGATIVE mg/dL   Protein, ur NEGATIVE NEGATIVE mg/dL   Nitrite NEGATIVE NEGATIVE   Leukocytes, UA NEGATIVE NEGATIVE   RBC / HPF 6-10 0 - 5 RBC/hpf   WBC, UA 6-10 0 - 5 WBC/hpf   Bacteria, UA NONE SEEN NONE SEEN   Squamous Epithelial / LPF 11-20 0 - 5   Mucus PRESENT   hCG, quantitative, pregnancy     Status: Abnormal   Collection Time: 06/18/18 11:37 AM  Result Value Ref Range   hCG, Beta Chain, Quant, S 962,952 (H) <5 mIU/mL  Wet prep, genital     Status: Abnormal   Collection Time: 06/18/18 11:52 AM  Result Value Ref Range   Yeast Wet Prep HPF POC NONE SEEN NONE SEEN   Trich, Wet Prep NONE SEEN NONE SEEN   Clue Cells Wet Prep HPF POC PRESENT (A) NONE SEEN   WBC, Wet Prep HPF POC FEW (A) NONE SEEN   Sperm NONE SEEN     US Ob Comp Less 14 Wks  Result Date: 06/18/2018 CLINICAL DATA:  Bleeding in early pregnancy. EXAM: OBSTETRIC <14 WK ULTRASOUND TECHNIQUE: Transabdominal ultrasound was performed for evaluation of the gestation as well as the maternal uterus and adnexal regions. COMPARISON:  None. FINDINGS: Intrauterine gestational sac: Single Yolk sac:  Visualized. Embryo:  Visualized. Cardiac Activity: Visualized. Heart Rate: 169 bpm CRL:   24 mm   9 w 0 d                  [place] EDC: 01/21/2019 Subchorionic hemorrhage:  None visualized. Maternal uterus/adnexae: Both ovaries are normal in appearance. No mass or abnormal free fluid identified. IMPRESSION: Single living IUP measuring 9 weeks 0 days, with [place] EDC of 01/21/2019. No significant maternal uterine or adnexal abnormality identified. Electronically Signed   By: Myles Rosenthal M.D.   On: 06/18/2018 14:15    Assessment and Plan  Bacterial  vaginitis  - Rx for Flagyl 500 mg BID x 7 days  Bleeding in early pregnancy  - Advised to return to MAU for heavy VB  Early stage of pregnancy  - F/U [facility] provider of choice   - Discharge patient - Patient verbalized an understanding of the plan of care and agrees.     Raelyn Mora, MSN, CNM 06/18/2018, 11:26 AM

## 2018-06-18 NOTE — Discharge Instructions (Signed)
Safe Medications in Pregnancy  ° °Acne: °Benzoyl Peroxide °Salicylic Acid ° °Backache/Headache: °Tylenol: 2 regular strength every 4 hours OR °             2 Extra strength every 6 hours ° °Colds/Coughs/Allergies: °Benadryl (alcohol free) 25 mg every 6 hours as needed °Breath right strips °Claritin °Cepacol throat lozenges °Chloraseptic throat spray °Cold-Eeze- up to three times per day °Cough drops, alcohol free °Flonase (by prescription only) °Guaifenesin °Mucinex °Robitussin DM (plain only, alcohol free) °Saline nasal spray/drops °Sudafed (pseudoephedrine) & Actifed ** use only after [redacted] weeks gestation and if you do not have high blood pressure °Tylenol °Vicks Vaporub °Zinc lozenges °Zyrtec  ° °Constipation: °Colace °Ducolax suppositories °Fleet enema °Glycerin suppositories °Metamucil °Milk of magnesia °Miralax °Senokot °Smooth move tea ° °Diarrhea: °Kaopectate °Imodium A-D ° °*NO pepto Bismol ° °Hemorrhoids: °Anusol °Anusol HC °Preparation H °Tucks ° °Indigestion: °Tums °Maalox °Mylanta °Zantac  °Pepcid ° °Insomnia: °Benadryl (alcohol free) 25mg every 6 hours as needed °Tylenol PM °Unisom, no Gelcaps ° °Leg Cramps: °Tums °MagGel ° °Nausea/Vomiting:  °Bonine °Dramamine °Emetrol °Ginger extract °Sea bands °Meclizine  °Nausea medication to take during pregnancy:  °Unisom (doxylamine succinate 25 mg tablets) Take one tablet daily at bedtime. If symptoms are not adequately controlled, the dose can be increased to a maximum recommended dose of two tablets daily (1/2 tablet in the morning, 1/2 tablet mid-afternoon and one at bedtime). °Vitamin B6 100mg tablets. Take one tablet twice a day (up to 200 mg per day). ° °Skin Rashes: °Aveeno products °Benadryl cream or 25mg every 6 hours as needed °Calamine Lotion °1% cortisone cream ° °Yeast infection: °Gyne-lotrimin 7 °Monistat 7 ° ° °**If taking multiple medications, please check labels to avoid duplicating the same active ingredients °**take medication as directed on  the label °** Do not exceed 4000 mg of tylenol in 24 hours °**Do not take medications that contain aspirin or ibuprofen ° ° °  ° °[facility] Providers  ° ° °[facility]       Phone: [phone] ° °[facility] Phone: [phone] ° °[facility] [place]  Phone: [phone] ° °[facility] [place]  Phone: [phone] ° °[facility] [place]  Phone: [phone] ° °[facility]                  Phone: [phone] ° °[facility]       Phone: [phone] ° °[facility]    Phone: [phone]  ° °[facility]    Phone: [phone] ° °[facility]    Phone: [phone] ° °[facility]    Phone: [phone] ° °[facility]    Phone: [phone] ° °[facility]       Phone: [phone]  ° °[facility]  Phone: [phone] ° °[facility]    Phone: [phone] ° °[facility]   Phone: [phone] ° °[facility]      Phone: [phone] ° °[facility]    Phone: [phone] ° ° °

## 2018-06-18 NOTE — MAU Note (Signed)
Pt reports she awakened this am with bright red bleeding. Was seen at [facility] 2 days ago for same but did not have an u/s. Reports right lower abd and right shoulder pain since this am.

## 2018-06-19 LAB — GC/CHLAMYDIA PROBE AMP (~~LOC~~) NOT AT ARMC
CHLAMYDIA, DNA PROBE: NEGATIVE
Neisseria Gonorrhea: NEGATIVE

## 2018-06-23 ENCOUNTER — Telehealth: Payer: Self-pay | Admitting: General Practice

## 2018-06-23 NOTE — Telephone Encounter (Signed)
Called patient to schedule New OB.  Patient verbalized understanding.

## 2018-07-02 ENCOUNTER — Encounter: Payer: Self-pay | Admitting: General Practice

## 2018-07-02 ENCOUNTER — Other Ambulatory Visit: Payer: Self-pay

## 2018-07-02 ENCOUNTER — Ambulatory Visit: Payer: BLUE CROSS/BLUE SHIELD | Admitting: *Deleted

## 2018-07-02 DIAGNOSIS — Z3400 Encounter for supervision of normal first pregnancy, unspecified trimester: Secondary | ICD-10-CM

## 2018-07-02 LAB — POCT URINALYSIS DIPSTICK OB
Glucose, UA: NEGATIVE
Leukocytes, UA: NEGATIVE
NITRITE UA: NEGATIVE
PH UA: 6.5 (ref 5.0–8.0)
RBC UA: NEGATIVE
SPEC GRAV UA: 1.025 (ref 1.010–1.025)
UROBILINOGEN UA: 2 U/dL — AB

## 2018-07-02 MED ORDER — METOCLOPRAMIDE HCL 10 MG PO TABS: 10.0000 mg | ORAL_TABLET | Freq: Four times a day (QID) | ORAL | 0 refills | Status: DC | PRN

## 2018-07-02 NOTE — Patient Instructions (Signed)
   Genetic Screening Results Information: You are having genetic testing called [facility] today.  It will take approximately 2 weeks before the results are available.  To get your results, you need Internet access to a web browser to search [place]/MyChart (the direct app on your phone will not give you these results).  Then select Lab Scanned and click on the blue hyper link that says View Image to see your [facility] results.  You can also use the directions on the purple card given to look up your results directly on the [facility] website.  Preterm labor precautions given.  Sign up for MyChart.

## 2018-07-02 NOTE — Progress Notes (Signed)
   PRENATAL INTAKE SUMMARY  Ms. Stephanie Flowers presents today New OB Nurse Interview.  OB History    Gravida  1   Para      Term      Preterm      AB      Living        SAB      TAB      Ectopic      Multiple      Live Births             I have reviewed the patient's medical, obstetrical, social, and family histories, medications, and available lab results.  SUBJECTIVE She has no unusual complaints and complains of nausea with vomiting for the last month.   OBJECTIVE Initial nurse interview for history and lab work (New OB).  GENERAL APPEARANCE: alert, well appearing.   ASSESSMENT Normal pregnancy. EDD: 01/18/18 confirmed by LMP and Ultrasound 06/18/18; GA 7481w2d; G1P0. Unable to assess fetal heart tones due to body habitus.   PLAN Prenatal care-[facility]/HIV  OB Urine Culture GC/CT completed at MAU 06/18/18, results negative [facility] Screening A1C [facility] screening without gender. Pt desires to wait until ultrasound sound appointment. Reglan 10 mg 1 PO every 6 hours as needed for nausea and vomiting. Advised patient to keep hydrated as much as possible.  Preterm labor precautions and when to contact clinic when needed. Medication list given. Continue PNV.  Genetic Screening Results Information: You are having genetic testing called [facility] today.  It will take approximately 2 weeks before the results are available.  To get your results, you need Internet access to a web browser to search [place]/MyChart (the direct app on your phone will not give you these results).  Then select Lab Scanned and click on the blue hyper link that says View Image to see your [facility] results.  You can also use the directions on the purple card given to look up your results directly on the [facility] website.  Stephanie Flowers, Stephanie Gatliff L, RN

## 2018-07-03 ENCOUNTER — Encounter: Payer: Self-pay | Admitting: General Practice

## 2018-07-04 LAB — HEMOGLOBINOPATHY EVALUATION
Ferritin: 119 ng/mL (ref 15–150)
HGB A: 97.9 % (ref 96.4–98.8)
HGB S: 0 %
HGB SOLUBILITY: NEGATIVE
Hgb A2 Quant: 2.1 % (ref 1.8–3.2)
Hgb C: 0 %
Hgb F Quant: 0 % (ref 0.0–2.0)
Hgb Variant: 0 %

## 2018-07-04 LAB — OBSTETRIC PANEL, INCLUDING HIV
Antibody Screen: NEGATIVE
BASOS ABS: 0 10*3/uL (ref 0.0–0.2)
BASOS: 0 %
EOS (ABSOLUTE): 0 10*3/uL (ref 0.0–0.4)
Eos: 0 %
HEMATOCRIT: 40.1 % (ref 34.0–46.6)
HEMOGLOBIN: 13.5 g/dL (ref 11.1–15.9)
HIV Screen 4th Generation wRfx: NONREACTIVE
Hepatitis B Surface Ag: NEGATIVE
IMMATURE GRANS (ABS): 0 10*3/uL (ref 0.0–0.1)
Immature Granulocytes: 0 %
LYMPHS: 16 %
Lymphocytes Absolute: 1.7 10*3/uL (ref 0.7–3.1)
MCH: 27.2 pg (ref 26.6–33.0)
MCHC: 33.7 g/dL (ref 31.5–35.7)
MCV: 81 fL (ref 79–97)
MONOCYTES: 6 %
Monocytes Absolute: 0.7 10*3/uL (ref 0.1–0.9)
Neutrophils Absolute: 8.3 10*3/uL — ABNORMAL HIGH (ref 1.4–7.0)
Neutrophils: 78 %
Platelets: 321 10*3/uL (ref 150–450)
RBC: 4.97 x10E6/uL (ref 3.77–5.28)
RDW: 14.4 % (ref 12.3–15.4)
RPR Ser Ql: NONREACTIVE
RUBELLA: 1.07 {index} (ref >0.99)
Rh Factor: POSITIVE
WBC: 10.7 10*3/uL (ref 3.4–10.8)

## 2018-07-04 LAB — HEMOGLOBIN A1C
ESTIMATED AVERAGE GLUCOSE: 114 mg/dL
Hgb A1c MFr Bld: 5.6 % (ref 4.8–5.6)

## 2018-07-04 LAB — CULTURE, OB URINE

## 2018-07-04 LAB — URINE CULTURE, OB REFLEX

## 2018-07-14 ENCOUNTER — Encounter: Payer: Self-pay | Admitting: General Practice

## 2018-07-14 ENCOUNTER — Encounter (HOSPITAL_COMMUNITY): Payer: Self-pay | Admitting: *Deleted

## 2018-07-14 ENCOUNTER — Inpatient Hospital Stay (HOSPITAL_COMMUNITY)
Admission: AD | Admit: 2018-07-14 | Discharge: 2018-07-14 | Disposition: A | Discharge status: Home / Self Care | Payer: BLUE CROSS/BLUE SHIELD | Source: Ambulatory Visit | Attending: Family Medicine | Admitting: Family Medicine

## 2018-07-14 DIAGNOSIS — O211 Hyperemesis gravidarum with metabolic disturbance: Secondary | ICD-10-CM

## 2018-07-14 DIAGNOSIS — R7989 Other specified abnormal findings of blood chemistry: Secondary | ICD-10-CM

## 2018-07-14 DIAGNOSIS — O210 Mild hyperemesis gravidarum: Secondary | ICD-10-CM | POA: Insufficient documentation

## 2018-07-14 DIAGNOSIS — O209 Hemorrhage in early pregnancy, unspecified: Secondary | ICD-10-CM

## 2018-07-14 DIAGNOSIS — N760 Acute vaginitis: Secondary | ICD-10-CM

## 2018-07-14 DIAGNOSIS — Z3A13 13 weeks gestation of pregnancy: Secondary | ICD-10-CM

## 2018-07-14 DIAGNOSIS — R748 Abnormal levels of other serum enzymes: Secondary | ICD-10-CM

## 2018-07-14 DIAGNOSIS — Z3490 Encounter for supervision of normal pregnancy, unspecified, unspecified trimester: Secondary | ICD-10-CM

## 2018-07-14 DIAGNOSIS — R945 Abnormal results of liver function studies: Secondary | ICD-10-CM

## 2018-07-14 DIAGNOSIS — B9689 Other specified bacterial agents as the cause of diseases classified elsewhere: Secondary | ICD-10-CM

## 2018-07-14 DIAGNOSIS — Z3400 Encounter for supervision of normal first pregnancy, unspecified trimester: Secondary | ICD-10-CM

## 2018-07-14 DIAGNOSIS — K117 Disturbances of salivary secretion: Secondary | ICD-10-CM

## 2018-07-14 LAB — CBC
HCT: 42 % (ref 36.0–46.0)
Hemoglobin: 14 g/dL (ref 12.0–15.0)
MCH: 27.3 pg (ref 26.0–34.0)
MCHC: 33.3 g/dL (ref 30.0–36.0)
MCV: 82 fL (ref 78.0–100.0)
Platelets: 297 10*3/uL (ref 150–400)
RBC: 5.12 MIL/uL — ABNORMAL HIGH (ref 3.87–5.11)
RDW: 12.9 % (ref 11.5–15.5)
WBC: 13.9 10*3/uL — ABNORMAL HIGH (ref 4.0–10.5)

## 2018-07-14 LAB — URINALYSIS, ROUTINE W REFLEX MICROSCOPIC
Bacteria, UA: NONE SEEN
Glucose, UA: NEGATIVE mg/dL
Hgb urine dipstick: NEGATIVE
Ketones, ur: 80 mg/dL — AB
Leukocytes, UA: NEGATIVE
Nitrite: NEGATIVE
Protein, ur: 100 mg/dL — AB
Specific Gravity, Urine: 1.033 — ABNORMAL HIGH (ref 1.005–1.030)
pH: 6 (ref 5.0–8.0)

## 2018-07-14 LAB — COMPREHENSIVE METABOLIC PANEL
ALT: 382 U/L — ABNORMAL HIGH (ref 0–44)
AST: 140 U/L — ABNORMAL HIGH (ref 15–41)
Albumin: 4.1 g/dL (ref 3.5–5.0)
Alkaline Phosphatase: 83 U/L (ref 38–126)
Anion gap: 14 (ref 5–15)
BUN: 7 mg/dL (ref 6–20)
CO2: 23 mmol/L (ref 22–32)
Calcium: 10 mg/dL (ref 8.9–10.3)
Chloride: 99 mmol/L (ref 98–111)
Creatinine, Ser: 0.88 mg/dL (ref 0.44–1.00)
GFR calc Af Amer: >60 mL/min (ref >60)
GFR calc non Af Amer: >60 mL/min (ref >60)
Glucose, Bld: 101 mg/dL — ABNORMAL HIGH (ref 70–99)
Potassium: 3.6 mmol/L (ref 3.5–5.1)
Sodium: 136 mmol/L (ref 135–145)
Total Bilirubin: 1.1 mg/dL (ref 0.3–1.2)
Total Protein: 7.7 g/dL (ref 6.5–8.1)

## 2018-07-14 MED ORDER — FAMOTIDINE IN NACL 20-0.9 MG/50ML-% IV SOLN
20.0000 mg | Freq: Once | INTRAVENOUS | Status: AC
  Administered 2018-07-14: 20 mg via INTRAVENOUS
  Filled 2018-07-14: qty 50

## 2018-07-14 MED ORDER — PROMETHAZINE HCL 25 MG/ML IJ SOLN: 25.0000 mg | Freq: Once | INTRAMUSCULAR | Status: DC

## 2018-07-14 MED ORDER — PROCHLORPERAZINE EDISYLATE 10 MG/2ML IJ SOLN
10.0000 mg | Freq: Once | INTRAMUSCULAR | Status: AC
  Administered 2018-07-14: 10 mg via INTRAVENOUS
  Filled 2018-07-14: qty 2

## 2018-07-14 MED ORDER — M.V.I. ADULT IV INJ
Freq: Once | INTRAVENOUS | Status: AC
  Administered 2018-07-14: 12:00:00 via INTRAVENOUS
  Filled 2018-07-14: qty 10

## 2018-07-14 MED ORDER — PROCHLORPERAZINE 25 MG RE SUPP: 25.0000 mg | Freq: Two times a day (BID) | RECTAL | 1 refills | Status: AC | PRN

## 2018-07-14 MED ORDER — LACTATED RINGERS IV BOLUS
1000.0000 mL | Freq: Once | INTRAVENOUS | Status: AC
  Administered 2018-07-14: 1000 mL via INTRAVENOUS

## 2018-07-14 MED ORDER — SODIUM CHLORIDE 0.9 % IV SOLN
8.0000 mg | Freq: Once | INTRAVENOUS | Status: DC
  Filled 2018-07-14: qty 4

## 2018-07-14 NOTE — MAU Provider Note (Signed)
History     CSN: [id number]  Arrival date and time: 07/14/18 4098   First Provider Initiated Contact with Patient 07/14/18 [phone]      Chief Complaint  Patient presents with  . dehydrated  . Nausea   Stephanie Flowers is a 22 y.o. G1P0 at [redacted]w[redacted]d who presents to MAU with complaints of nausea and vomiting. She reports N/V has been occurring throughout this pregnancy but has worsened over the past week. She reports being unable to keep fluids and food down over the past 2-3 days.  She reports vomiting 5-6 times over the past 24 hours. She reports near syncope and dizziness occurring on Friday while she was in the shower. She denies abdominal trauma and denies abdominal cramping or pain. She has tried Zofran, Phenergan, and Reglan for nausea and vomiting. Reports last time taking medication was "sometime this week". She denies vaginal bleeding, abdominal pain, discharge, urinary symptoms, or HA.   OB History    Gravida  1   Para      Term      Preterm      AB      Living        SAB      TAB      Ectopic      Multiple      Live Births              Past Medical History:  Diagnosis Date  . Medical history non-contributory     Past Surgical History:  Procedure Laterality Date  . NO PAST SURGERIES      Family History  Problem Relation Age of Onset  . Diabetes Maternal Aunt   . Hypertension Maternal Grandmother   . Diabetes Father     Social History   Tobacco Use  . Smoking status: Never Smoker  . Smokeless tobacco: Never Used  Substance Use Topics  . Alcohol use: Never    Frequency: Never  . Drug use: Not Currently    Types: Marijuana    Comment: last used when first found out about pregancy    Allergies: No Known Allergies  Medications Prior to Admission  Medication Sig Dispense Refill Last Dose  . Prenatal Multivit-Min-Fe-FA (PRENATAL VITAMINS) 0.8 MG tablet Take 1 tablet by mouth daily. 30 tablet 0 07/13/2018 at Unknown time  . glycopyrrolate  (ROBINUL) 1 MG tablet Take 1 tablet (1 mg total) by mouth 3 (three) times daily. (Patient not taking: Reported on 07/02/2018) 90 tablet 1 Not Taking  . metoCLOPramide (REGLAN) 10 MG tablet Take 1 tablet (10 mg total) by mouth every 6 (six) hours as needed for nausea. (Patient not taking: Reported on 07/14/2018) 28 tablet 0 Not Taking at Unknown time  . promethazine (PHENERGAN) 25 MG suppository Place 1 suppository (25 mg total) rectally every 6 (six) hours as needed for nausea or vomiting. (Patient not taking: Reported on 07/02/2018) 12 each 0 Not Taking  . promethazine (PHENERGAN) 25 MG tablet Take 1 tablet (25 mg total) by mouth every 8 (eight) hours as needed for nausea or vomiting. (Patient not taking: Reported on 07/02/2018) 30 tablet 0 Not Taking  . ranitidine (ZANTAC) 150 MG tablet Take 1 tablet (150 mg total) by mouth 2 (two) times daily. (Patient not taking: Reported on 07/02/2018) 60 tablet 0 Not Taking    Review of Systems  Constitutional: Negative.   Respiratory: Negative.   Cardiovascular: Negative.   Gastrointestinal: Positive for nausea and vomiting. Negative for abdominal pain, constipation and diarrhea.  Genitourinary: Negative.   Neurological: Negative.    Physical Exam   Blood pressure 129/71, pulse (!) 104, temperature (!) 97.5 F (36.4 C), temperature source Oral, resp. rate 16, weight 104 kg, last menstrual period 04/14/2018.  Physical Exam  Nursing note and vitals reviewed. Constitutional: She is oriented to person, place, and time. She appears well-developed and well-nourished. She appears distressed.  Cardiovascular: Normal rate, regular rhythm and normal heart sounds.  Respiratory: Effort normal and breath sounds normal. No respiratory distress. She has no wheezes.  GI: Soft. Bowel sounds are normal. She exhibits no distension. There is no tenderness.  Musculoskeletal: Normal range of motion. She exhibits no edema.  Neurological: She is alert and oriented to person,  place, and time.  Skin: Skin is warm and dry. No pallor.  Psychiatric: She has a normal mood and affect. Her behavior is normal. Thought content normal.   FHR: 148 by doppler   MAU Course  Procedures  MDM Orders Placed This Encounter  Procedures  . US Abdomen Limited RUQ  . Urinalysis, Routine w reflex microscopic  . CBC  . Comprehensive metabolic panel   Meds ordered this encounter  Medications  . lactated ringers bolus 1,000 mL  . multivitamins adult (MVI -12) 10 mL in dextrose 5% lactated ringers 1,000 mL infusion  . DISCONTD: promethazine (PHENERGAN) injection 25 mg  . famotidine (PEPCID) IVPB 20 mg premix  . DISCONTD: ondansetron (ZOFRAN) 8 mg in sodium chloride 0.9 % 50 mL IVPB  . prochlorperazine (COMPAZINE) injection 10 mg  . prochlorperazine (COMPAZINE) 25 MG suppository    Sig: Place 1 suppository (25 mg total) rectally every 12 (twelve) hours as needed for nausea or vomiting.    Dispense:  12 suppository    Refill:  1    Order Specific Question:   Supervising Provider    Answer:   Samara Snide   Labs reviewed:  Results for orders placed or performed during the hospital encounter of 07/14/18 (from the past 24 hour(s))  Urinalysis, Routine w reflex microscopic     Status: Abnormal   Collection Time: 07/14/18  9:35 AM  Result Value Ref Range   Color, Urine AMBER (A) YELLOW   APPearance HAZY (A) CLEAR   Specific Gravity, Urine 1.033 (H) 1.005 - 1.030   pH 6.0 5.0 - 8.0   Glucose, UA NEGATIVE NEGATIVE mg/dL   Hgb urine dipstick NEGATIVE NEGATIVE   Bilirubin Urine SMALL (A) NEGATIVE   Ketones, ur 80 (A) NEGATIVE mg/dL   Protein, ur 161 (A) NEGATIVE mg/dL   Nitrite NEGATIVE NEGATIVE   Leukocytes, UA NEGATIVE NEGATIVE   RBC / HPF 0-5 0 - 5 RBC/hpf   WBC, UA 11-20 0 - 5 WBC/hpf   Bacteria, UA NONE SEEN NONE SEEN   Squamous Epithelial / LPF 0-5 0 - 5   Mucus PRESENT   CBC     Status: Abnormal   Collection Time: 07/14/18 10:52 AM  Result Value Ref Range    WBC 13.9 (H) 4.0 - 10.5 K/uL   RBC 5.12 (H) 3.87 - 5.11 MIL/uL   Hemoglobin 14.0 12.0 - 15.0 g/dL   HCT 09.6 04.5 - 40.9 %   MCV 82.0 78.0 - 100.0 fL   MCH 27.3 26.0 - 34.0 pg   MCHC 33.3 30.0 - 36.0 g/dL   RDW 81.1 91.4 - 78.2 %   Platelets 297 150 - 400 K/uL  Comprehensive metabolic panel     Status: Abnormal   Collection Time: 07/14/18  10:52 AM  Result Value Ref Range   Sodium 136 135 - 145 mmol/L   Potassium 3.6 3.5 - 5.1 mmol/L   Chloride 99 98 - 111 mmol/L   CO2 23 22 - 32 mmol/L   Glucose, Bld 101 (H) 70 - 99 mg/dL   BUN 7 6 - 20 mg/dL   Creatinine, Ser 8.65 0.44 - 1.00 mg/dL   Calcium 78.4 8.9 - 69.6 mg/dL   Total Protein 7.7 6.5 - 8.1 g/dL   Albumin 4.1 3.5 - 5.0 g/dL   AST 295 (H) 15 - 41 U/L   ALT 382 (H) 0 - 44 U/L   Alkaline Phosphatase 83 38 - 126 U/L   Total Bilirubin 1.1 0.3 - 1.2 mg/dL   GFR calc non Af Amer >60 >60 mL/min   GFR calc Af Amer >60 >60 mL/min   Anion gap 14 5 - 15   Treatments in MAU included: LR bolus IV, banana bag, compazine 10mg  IV, and pepcid 20mg  IV. Patient able to tolerate gingerale and crackers prior to discharge home.  CMP- elevated ALT and AST, patient denies hx of hepatitis- acute hepatitis ordered for assessment with evaluation with RUQ abdominal US outpatient. Patient reports not being able to stay for [place] today. Pt stable at time of discharge.   Assessment and Plan   1. Hyperemesis gravidarum with dehydration   2. [redacted] weeks gestation of pregnancy   3. Elevated liver enzymes    Discharge home  Outpatient schedule of Abdominal RUQ [place]  Discussed reasons to return to MAU  Rx for Compazine suppository sent to pharmacy of choice  Hepatitis panel pending   Follow-up Information    [facility] Follow up.   Specialty:  Obstetrics and Gynecology Why:  Follow up as scheduled for initial prenatal appointment  Contact information: [address] [place] [facility] [place] [address] [phone]          Allergies as of 07/14/2018   No Known Allergies     Medication List    STOP taking these medications   metoCLOPramide 10 MG tablet Commonly known as:  REGLAN   promethazine 25 MG suppository Commonly known as:  PHENERGAN   promethazine 25 MG tablet Commonly known as:  PHENERGAN   ranitidine 150 MG tablet Commonly known as:  ZANTAC     TAKE these medications   glycopyrrolate 1 MG tablet Commonly known as:  ROBINUL Take 1 tablet (1 mg total) by mouth 3 (three) times daily.   Prenatal Vitamins 0.8 MG tablet Take 1 tablet by mouth daily.   prochlorperazine 25 MG suppository Commonly known as:  COMPAZINE Place 1 suppository (25 mg total) rectally every 12 (twelve) hours as needed for nausea or vomiting.       Sharyon Cable CNM 07/14/2018, 1:16 PM

## 2018-07-14 NOTE — MAU Provider Note (Signed)
History   Stephanie Flowers is a 22 y.o female at [redacted]w[redacted]d gestations. She presents with a chief complaint of nausea and vomiting. She states " I have no energy and I can't keep anything down". She reports nausea and vomiting throughout this pregnancy but states it has been the worst over the last week. She states that she vomits 5-6 times per day, especially after eating or drinking. She states she became dizzy on Friday and fell in the shower. She denies any head or abdominal trauma from the fall. Denies abdominal pain, diarrhea, constipation, lightheadedness, vaginal bleeding, and current dizziness. She states she has tried water, Gatorade, sprite, and crackers but none stay down. Patient states "none of the pills have worked".  CSN: [id number]  Arrival date and time: 07/14/18 4098   First Provider Initiated Contact with Patient 07/14/18 [phone]      Chief Complaint  Patient presents with  . dehydrated  . Nausea    OB History    Gravida  1   Para      Term      Preterm      AB      Living        SAB      TAB      Ectopic      Multiple      Live Births              Past Medical History:  Diagnosis Date  . Medical history non-contributory     Past Surgical History:  Procedure Laterality Date  . NO PAST SURGERIES      Family History  Problem Relation Age of Onset  . Diabetes Maternal Aunt   . Hypertension Maternal Grandmother   . Diabetes Father     Social History   Tobacco Use  . Smoking status: Never Smoker  . Smokeless tobacco: Never Used  Substance Use Topics  . Alcohol use: Never    Frequency: Never  . Drug use: Not Currently    Types: Marijuana    Comment: last used when first found out about pregancy    Allergies: No Known Allergies  Medications Prior to Admission  Medication Sig Dispense Refill Last Dose  . Prenatal Multivit-Min-Fe-FA (PRENATAL VITAMINS) 0.8 MG tablet Take 1 tablet by mouth daily. 30 tablet 0 07/13/2018 at Unknown time  .  glycopyrrolate (ROBINUL) 1 MG tablet Take 1 tablet (1 mg total) by mouth 3 (three) times daily. (Patient not taking: Reported on 07/02/2018) 90 tablet 1 Not Taking  . metoCLOPramide (REGLAN) 10 MG tablet Take 1 tablet (10 mg total) by mouth every 6 (six) hours as needed for nausea. (Patient not taking: Reported on 07/14/2018) 28 tablet 0 Not Taking at Unknown time  . promethazine (PHENERGAN) 25 MG suppository Place 1 suppository (25 mg total) rectally every 6 (six) hours as needed for nausea or vomiting. (Patient not taking: Reported on 07/02/2018) 12 each 0 Not Taking  . promethazine (PHENERGAN) 25 MG tablet Take 1 tablet (25 mg total) by mouth every 8 (eight) hours as needed for nausea or vomiting. (Patient not taking: Reported on 07/02/2018) 30 tablet 0 Not Taking  . ranitidine (ZANTAC) 150 MG tablet Take 1 tablet (150 mg total) by mouth 2 (two) times daily. (Patient not taking: Reported on 07/02/2018) 60 tablet 0 Not Taking    Review of Systems: As stated in the HPI. Physical Exam   Blood pressure 129/71, pulse (!) 104, temperature (!) 97.5 F (36.4 C), temperature source Oral,  resp. rate 16, weight 104 kg, last menstrual period 04/14/2018.  Results for orders placed or performed during the hospital encounter of 07/14/18 (from the past 24 hour(s))  Urinalysis, Routine w reflex microscopic     Status: Abnormal   Collection Time: 07/14/18  9:35 AM  Result Value Ref Range   Color, Urine AMBER (A) YELLOW   APPearance HAZY (A) CLEAR   Specific Gravity, Urine 1.033 (H) 1.005 - 1.030   pH 6.0 5.0 - 8.0   Glucose, UA NEGATIVE NEGATIVE mg/dL   Hgb urine dipstick NEGATIVE NEGATIVE   Bilirubin Urine SMALL (A) NEGATIVE   Ketones, ur 80 (A) NEGATIVE mg/dL   Protein, ur 161 (A) NEGATIVE mg/dL   Nitrite NEGATIVE NEGATIVE   Leukocytes, UA NEGATIVE NEGATIVE   RBC / HPF 0-5 0 - 5 RBC/hpf   WBC, UA 11-20 0 - 5 WBC/hpf   Bacteria, UA NONE SEEN NONE SEEN   Squamous Epithelial / LPF 0-5 0 - 5   Mucus  PRESENT   CBC     Status: Abnormal   Collection Time: 07/14/18 10:52 AM  Result Value Ref Range   WBC 13.9 (H) 4.0 - 10.5 K/uL   RBC 5.12 (H) 3.87 - 5.11 MIL/uL   Hemoglobin 14.0 12.0 - 15.0 g/dL   HCT 09.6 04.5 - 40.9 %   MCV 82.0 78.0 - 100.0 fL   MCH 27.3 26.0 - 34.0 pg   MCHC 33.3 30.0 - 36.0 g/dL   RDW 81.1 91.4 - 78.2 %   Platelets 297 150 - 400 K/uL  Comprehensive metabolic panel     Status: Abnormal   Collection Time: 07/14/18 10:52 AM  Result Value Ref Range   Sodium 136 135 - 145 mmol/L   Potassium 3.6 3.5 - 5.1 mmol/L   Chloride 99 98 - 111 mmol/L   CO2 23 22 - 32 mmol/L   Glucose, Bld 101 (H) 70 - 99 mg/dL   BUN 7 6 - 20 mg/dL   Creatinine, Ser 9.56 0.44 - 1.00 mg/dL   Calcium 21.3 8.9 - 08.6 mg/dL   Total Protein 7.7 6.5 - 8.1 g/dL   Albumin 4.1 3.5 - 5.0 g/dL   AST 578 (H) 15 - 41 U/L   ALT 382 (H) 0 - 44 U/L   Alkaline Phosphatase 83 38 - 126 U/L   Total Bilirubin 1.1 0.3 - 1.2 mg/dL   GFR calc non Af Amer >60 >60 mL/min   GFR calc Af Amer >60 >60 mL/min   Anion gap 14 5 - 15   MAU Course   MDM UA: ketones suggestive of dehydration.  CMP: Elevated AST and ALT.   Assessment and Plan  Assessment: Stephanie Flowers is a G1P0 female presenting for nausea and vomiting.  Plan: Give IV LR, IV Pepcid and Compazine injection for nausea. Monitor response to medications. Outpatient [place] scheduled due to elevated ALT and AST.   Charyl Dancer 07/14/2018, 1:18 PM

## 2018-07-14 NOTE — MAU Note (Signed)
Patient presents with c/o that she feels dehydrated and has no energy.  States she is unable to tolerate PO fluids and "can't keep anything down."  Denies taking any meds for nausea.

## 2018-07-15 LAB — HEPATITIS PANEL, ACUTE
HCV Ab: <0.1 s/co ratio (ref 0.0–0.9)
Hep A IgM: NEGATIVE
Hep B C IgM: NEGATIVE
Hepatitis B Surface Ag: NEGATIVE

## 2018-07-17 ENCOUNTER — Encounter: Payer: BLUE CROSS/BLUE SHIELD | Admitting: Obstetrics and Gynecology

## 2018-07-18 ENCOUNTER — Encounter: Payer: Self-pay | Admitting: General Practice

## 2018-07-23 ENCOUNTER — Ambulatory Visit (HOSPITAL_COMMUNITY): Payer: BLUE CROSS/BLUE SHIELD

## 2018-08-01 ENCOUNTER — Ambulatory Visit (HOSPITAL_COMMUNITY)
Admission: RE | Admit: 2018-08-01 | Discharge: 2018-08-01 | Disposition: A | Discharge status: Home / Self Care | Payer: BLUE CROSS/BLUE SHIELD | Source: Ambulatory Visit | Attending: Certified Nurse Midwife | Admitting: Certified Nurse Midwife

## 2018-08-01 DIAGNOSIS — R748 Abnormal levels of other serum enzymes: Secondary | ICD-10-CM | POA: Insufficient documentation

## 2018-08-01 DIAGNOSIS — Z3A13 13 weeks gestation of pregnancy: Secondary | ICD-10-CM

## 2018-08-13 ENCOUNTER — Encounter: Payer: Self-pay | Admitting: Obstetrics

## 2018-08-13 ENCOUNTER — Ambulatory Visit (INDEPENDENT_AMBULATORY_CARE_PROVIDER_SITE_OTHER): Payer: BLUE CROSS/BLUE SHIELD | Admitting: Obstetrics

## 2018-08-13 VITALS — Wt 246.1 lb

## 2018-08-13 DIAGNOSIS — Z3400 Encounter for supervision of normal first pregnancy, unspecified trimester: Secondary | ICD-10-CM

## 2018-08-13 DIAGNOSIS — Z3402 Encounter for supervision of normal first pregnancy, second trimester: Secondary | ICD-10-CM

## 2018-08-13 NOTE — Progress Notes (Signed)
Patient reports fetal movement, denies pain. Pt declines pap, states that [facility], does not want female provider.

## 2018-08-13 NOTE — Progress Notes (Signed)
Subjective:  Stephanie Flowers is a 22 y.o. G1P0 at [redacted]w[redacted]d being seen today for ongoing prenatal care.  She is currently monitored for the following issues for this low-risk pregnancy and has Bacterial vaginitis; Bleeding in early pregnancy; Early stage of pregnancy; Hypersalivation; and Supervision of normal first pregnancy, antepartum on their problem list.  Patient reports no complaints.  Contractions: Not present. Vag. Bleeding: None.  Movement: Present. Denies leaking of fluid.   The following portions of the patient's history were reviewed and updated as appropriate: allergies, current medications, past family history, past medical history, past social history, past surgical history and problem list. Problem list updated.  Objective:   Vitals:   08/13/18 1402  Weight: 246 lb 1.6 oz (111.6 kg)    Fetal Status: Fetal Heart Rate (bpm): 150   Movement: Present     General:  Alert, oriented and cooperative. Patient is in no acute distress.  Skin: Skin is warm and dry. No rash noted.   Cardiovascular: Normal heart rate noted  Respiratory: Normal respiratory effort, no problems with respiration noted  Abdomen: Soft, gravid, appropriate for gestational age. Pain/Pressure: Absent     Pelvic:  Cervical exam deferred        Extremities: Normal range of motion.  Edema: None  Mental Status: Normal mood and affect. Normal behavior. Normal judgment and thought content.   Urinalysis:      Assessment and Plan:  Pregnancy: G1P0 at [redacted]w[redacted]d  1. Supervision of normal first pregnancy, antepartum Rx: - AFP, Serum, Open Spina Bifida - [place] MFM OB COMP + 14 WK; Future  Preterm labor symptoms and general obstetric precautions including but not limited to vaginal bleeding, contractions, leaking of fluid and fetal movement were reviewed in detail with the patient. Please refer to After Visit Summary for other counseling recommendations.  Return in about 4 weeks (around 09/10/2018) for ROB.   Brock Bad, MD

## 2018-08-15 LAB — AFP, SERUM, OPEN SPINA BIFIDA
AFP MoM: 1.91
AFP Value: 60.8 ng/mL
GEST. AGE ON COLLECTION DATE: 17.2 wk
Maternal Age At EDD: 24.1 yr
OSBR RISK 1 IN: 1975
TEST RESULTS AFP: NEGATIVE
WEIGHT: 246 [lb_av]

## 2018-08-18 ENCOUNTER — Encounter (HOSPITAL_COMMUNITY): Payer: Self-pay

## 2018-08-25 ENCOUNTER — Ambulatory Visit (HOSPITAL_COMMUNITY)
Admission: RE | Admit: 2018-08-25 | Discharge: 2018-08-25 | Disposition: A | Discharge status: Home / Self Care | Payer: BLUE CROSS/BLUE SHIELD | Source: Ambulatory Visit | Attending: Obstetrics | Admitting: Obstetrics

## 2018-08-25 ENCOUNTER — Other Ambulatory Visit (HOSPITAL_COMMUNITY): Payer: Self-pay | Admitting: *Deleted

## 2018-08-25 ENCOUNTER — Other Ambulatory Visit: Payer: Self-pay | Admitting: Obstetrics

## 2018-08-25 DIAGNOSIS — Z3400 Encounter for supervision of normal first pregnancy, unspecified trimester: Secondary | ICD-10-CM

## 2018-08-25 DIAGNOSIS — Z363 Encounter for antenatal screening for malformations: Secondary | ICD-10-CM | POA: Diagnosis not present

## 2018-08-25 DIAGNOSIS — Z3A19 19 weeks gestation of pregnancy: Secondary | ICD-10-CM | POA: Diagnosis not present

## 2018-08-25 DIAGNOSIS — O09212 Supervision of pregnancy with history of pre-term labor, second trimester: Secondary | ICD-10-CM | POA: Diagnosis not present

## 2018-08-25 DIAGNOSIS — Z3689 Encounter for other specified antenatal screening: Secondary | ICD-10-CM | POA: Diagnosis not present

## 2018-08-25 DIAGNOSIS — Z362 Encounter for other antenatal screening follow-up: Secondary | ICD-10-CM

## 2018-08-25 DIAGNOSIS — O99212 Obesity complicating pregnancy, second trimester: Secondary | ICD-10-CM | POA: Insufficient documentation

## 2018-08-25 DIAGNOSIS — O283 Abnormal ultrasonic finding on antenatal screening of mother: Secondary | ICD-10-CM | POA: Insufficient documentation

## 2018-08-25 DIAGNOSIS — O289 Unspecified abnormal findings on antenatal screening of mother: Secondary | ICD-10-CM

## 2018-08-25 DIAGNOSIS — E669 Obesity, unspecified: Secondary | ICD-10-CM | POA: Diagnosis not present

## 2018-08-27 ENCOUNTER — Encounter: Payer: Self-pay | Admitting: Family Medicine

## 2018-08-27 DIAGNOSIS — O0930 Supervision of pregnancy with insufficient antenatal care, unspecified trimester: Secondary | ICD-10-CM | POA: Insufficient documentation

## 2018-08-27 DIAGNOSIS — R748 Abnormal levels of other serum enzymes: Secondary | ICD-10-CM | POA: Insufficient documentation

## 2018-08-27 DIAGNOSIS — O99210 Obesity complicating pregnancy, unspecified trimester: Secondary | ICD-10-CM | POA: Insufficient documentation

## 2018-09-10 ENCOUNTER — Encounter: Payer: BLUE CROSS/BLUE SHIELD | Admitting: Nurse Practitioner

## 2018-09-18 ENCOUNTER — Encounter: Payer: BLUE CROSS/BLUE SHIELD | Admitting: Obstetrics and Gynecology

## 2018-09-22 ENCOUNTER — Ambulatory Visit (HOSPITAL_COMMUNITY)
Admission: RE | Admit: 2018-09-22 | Discharge: 2018-09-22 | Disposition: A | Discharge status: Home / Self Care | Payer: BLUE CROSS/BLUE SHIELD | Source: Ambulatory Visit | Attending: Obstetrics | Admitting: Obstetrics

## 2018-09-22 DIAGNOSIS — O289 Unspecified abnormal findings on antenatal screening of mother: Secondary | ICD-10-CM | POA: Diagnosis not present

## 2018-09-22 DIAGNOSIS — Z3A23 23 weeks gestation of pregnancy: Secondary | ICD-10-CM | POA: Insufficient documentation

## 2018-09-22 DIAGNOSIS — O99212 Obesity complicating pregnancy, second trimester: Secondary | ICD-10-CM | POA: Diagnosis not present

## 2018-09-22 DIAGNOSIS — Z362 Encounter for other antenatal screening follow-up: Secondary | ICD-10-CM

## 2018-09-24 ENCOUNTER — Encounter: Payer: BLUE CROSS/BLUE SHIELD | Admitting: Obstetrics and Gynecology

## 2019-04-13 ENCOUNTER — Encounter (HOSPITAL_COMMUNITY): Payer: Self-pay

## 2020-03-27 IMAGING — US US OB COMP LESS 14 WK
1 series · 16 of 23 positions shown · non-contrast
Comparison: None.

CLINICAL DATA: Bleeding in early pregnancy.

EXAM:
OBSTETRIC <14 WK ULTRASOUND
TECHNIQUE: Transabdominal ultrasound was performed for evaluation of the
gestation as well as the maternal uterus and adnexal regions.

[Series 1: us ob comp less 14 wk · 23 acquisitions, 16 frames shown]
[im 1/23]
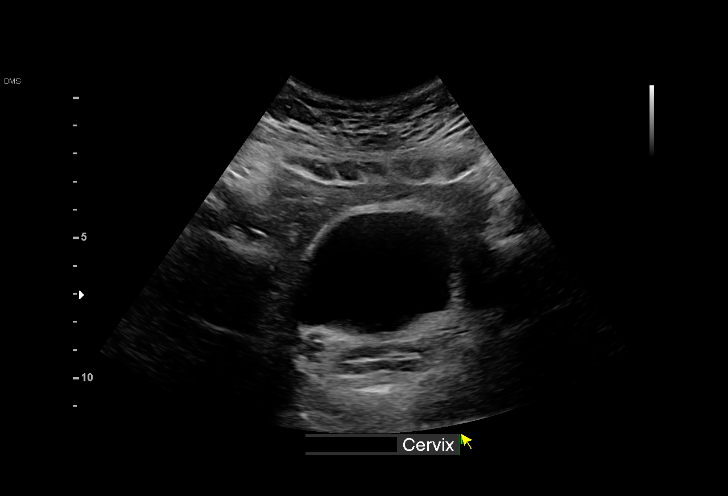
[im 3/23]
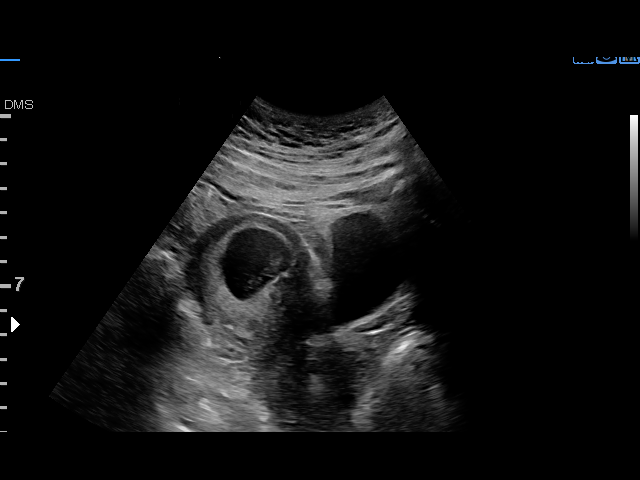
[im 4/23]
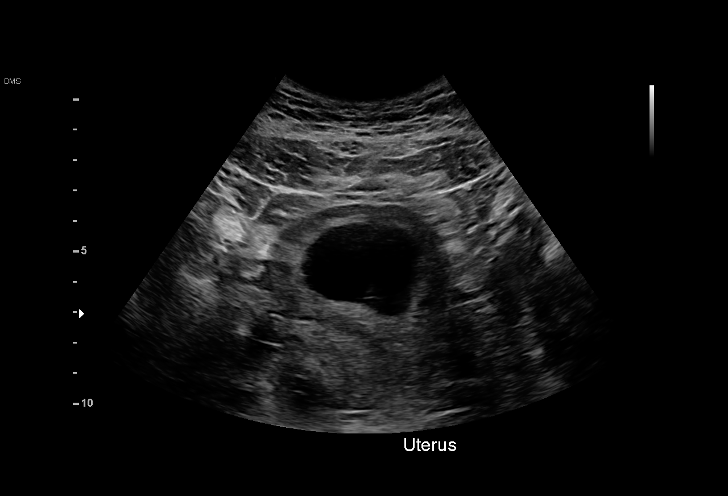
[im 6/23]
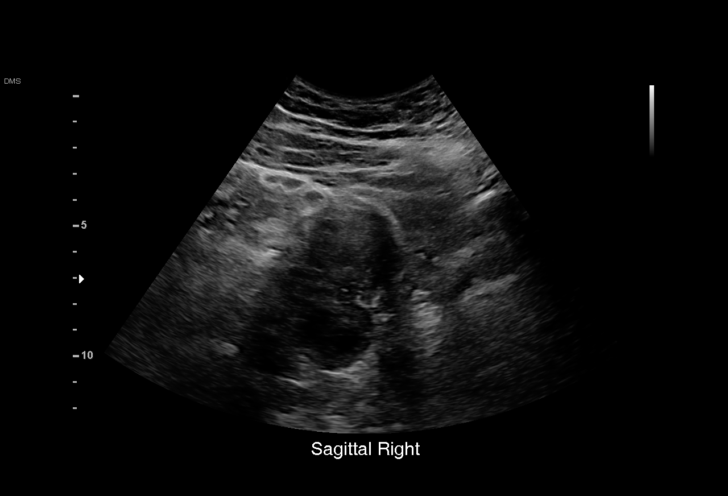
[im 7/23]
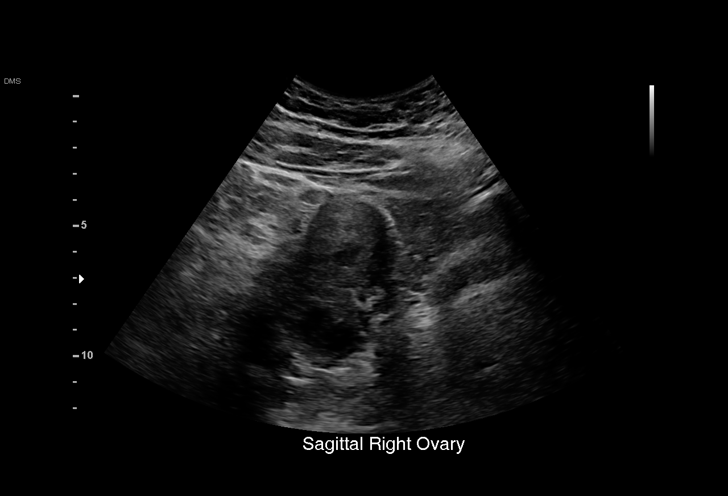
[im 8/23]
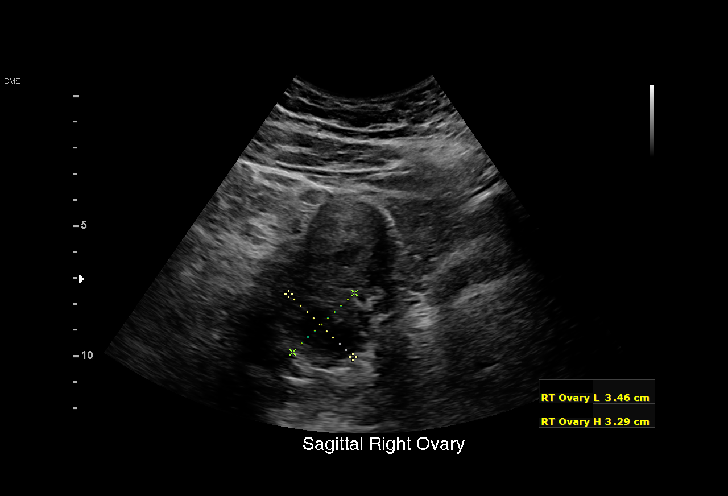
[im 10/23]
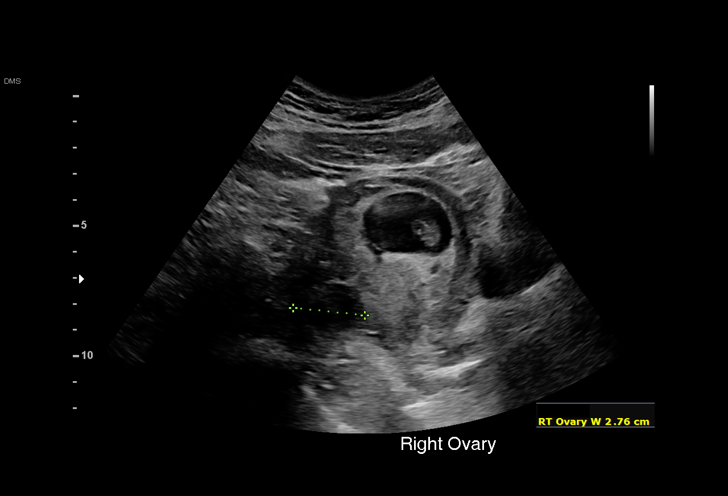
[im 11/23]
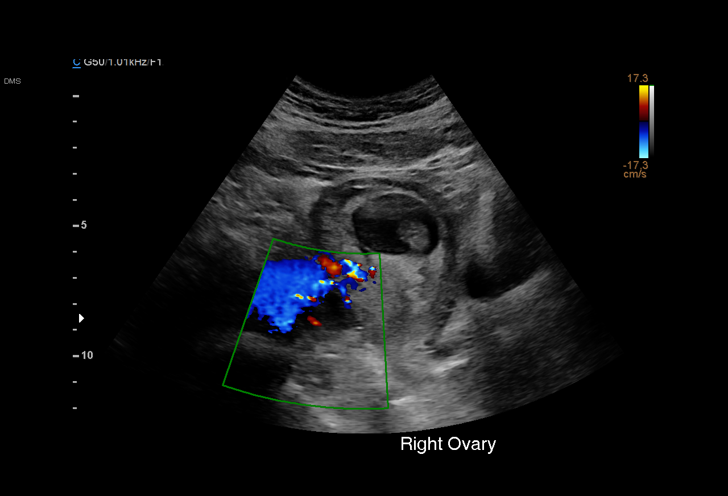
[im 13/23]
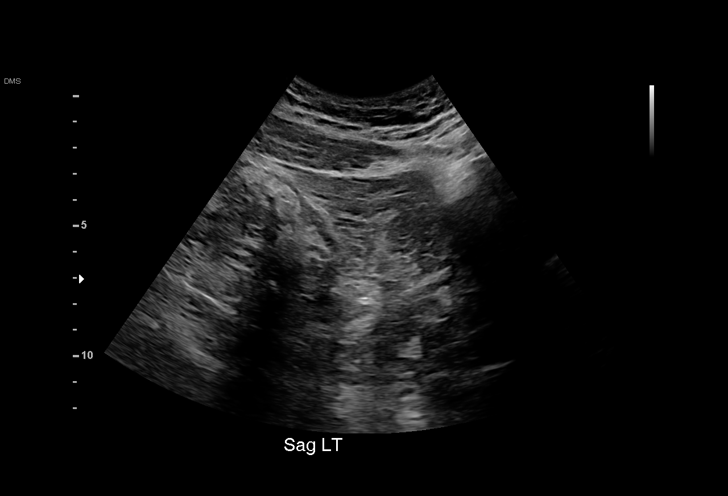
[im 14/23]
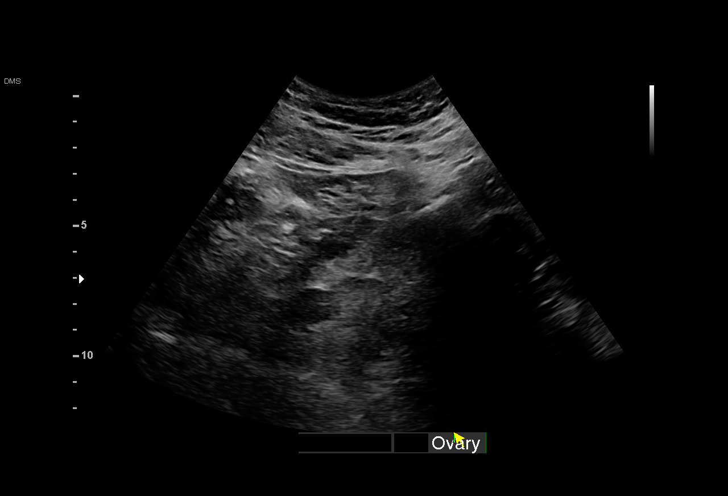
[im 16/23]
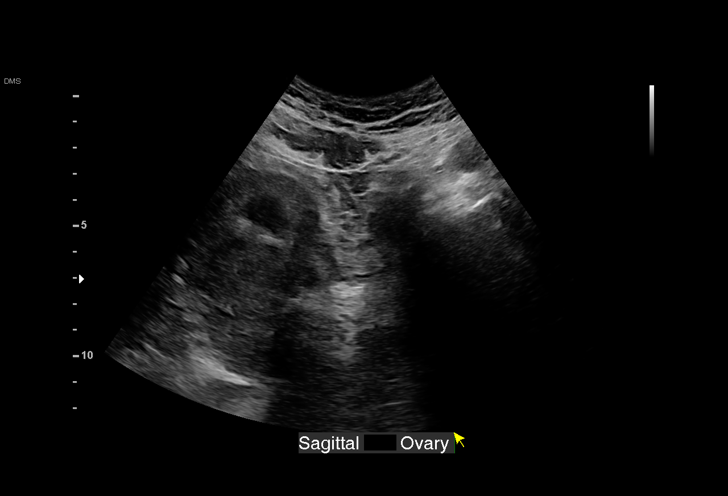
[im 17/23]
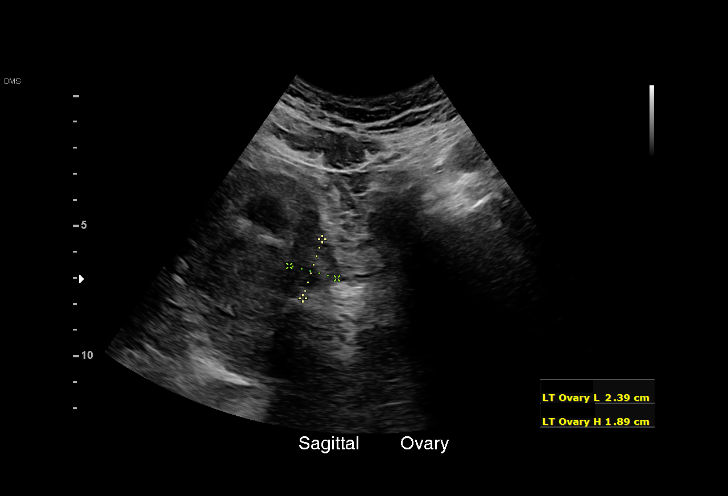
[im 18/23]
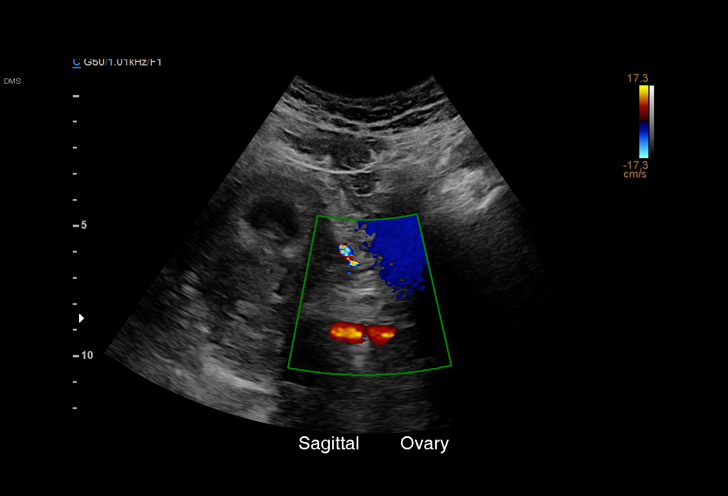
[im 20/23]
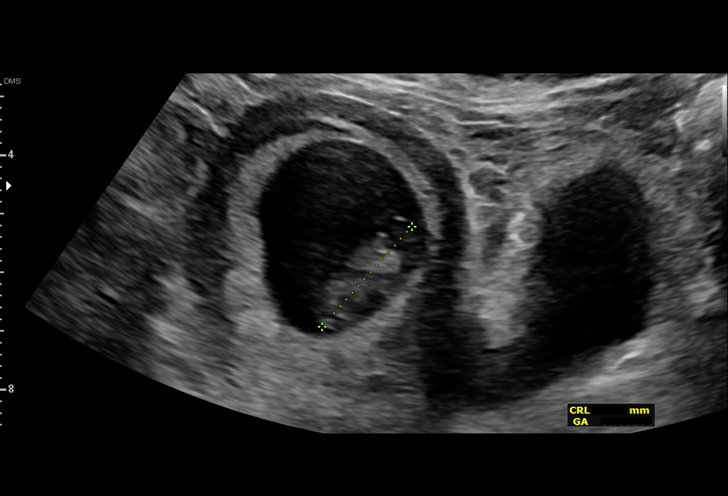
[im 21/23]
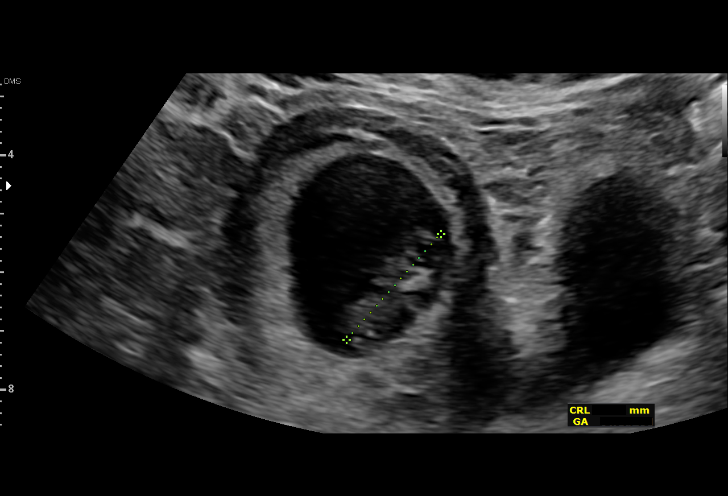
[im 23/23]
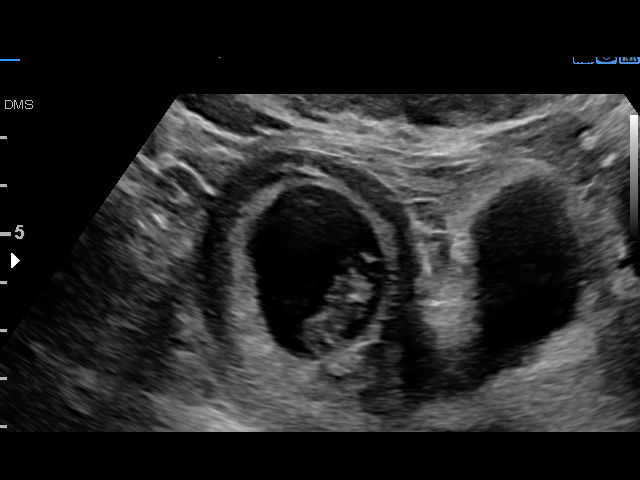

[16 of 23 positions shown; findings below may reference images not displayed]

FINDINGS: Intrauterine gestational sac: Single

Yolk sac:  Visualized.

Embryo:  Visualized.

Cardiac Activity: Visualized.

Heart Rate: 169 bpm

CRL:   24 mm   9 w 0 d                  US EDC: 01/21/2019

Subchorionic hemorrhage:  None visualized.

Maternal uterus/adnexae: Both ovaries are normal in appearance. No
mass or abnormal free fluid identified.
IMPRESSION: Single living IUP measuring 9 weeks 0 days, with US EDC of
01/21/2019.

No significant maternal uterine or adnexal abnormality identified.

## 2020-05-10 IMAGING — US US ABDOMEN LIMITED
1 series · 15 of 25 positions shown · non-contrast
Comparison: None.

CLINICAL DATA: Elevated liver enzymes

EXAM:
ULTRASOUND ABDOMEN LIMITED RIGHT UPPER QUADRANT

[Series 1: us abdomen limited · 15 of 48 slices shown]
[im 1/48]
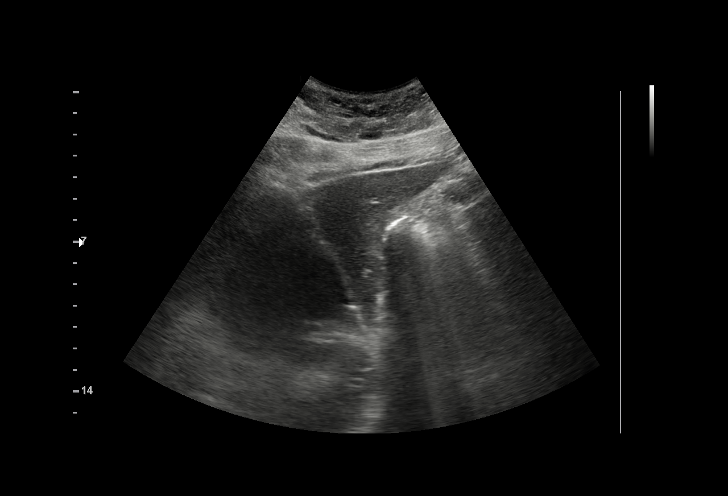
[im 4/48]
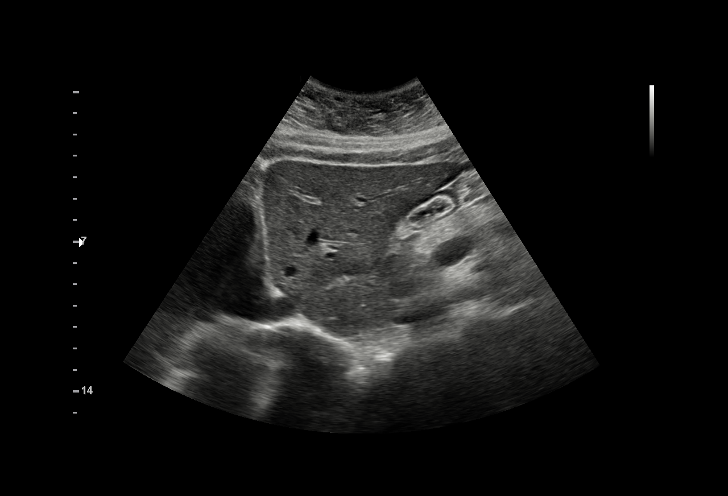
[im 8/48]
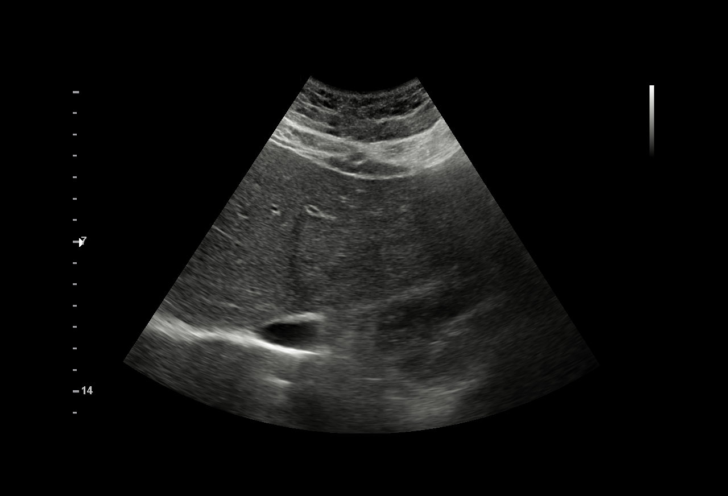
[im 10/48]
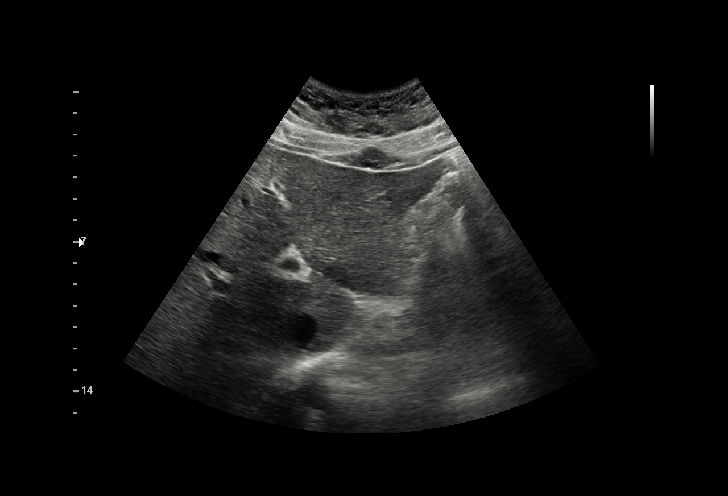
[im 14/48]
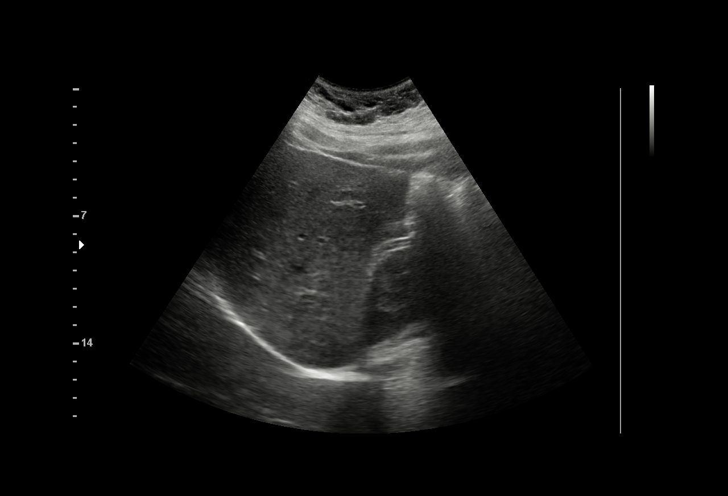
[im 18/48]
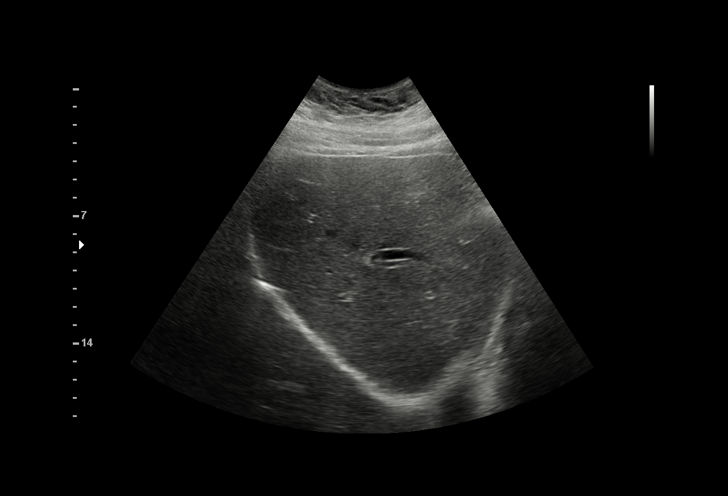
[im 20/48]
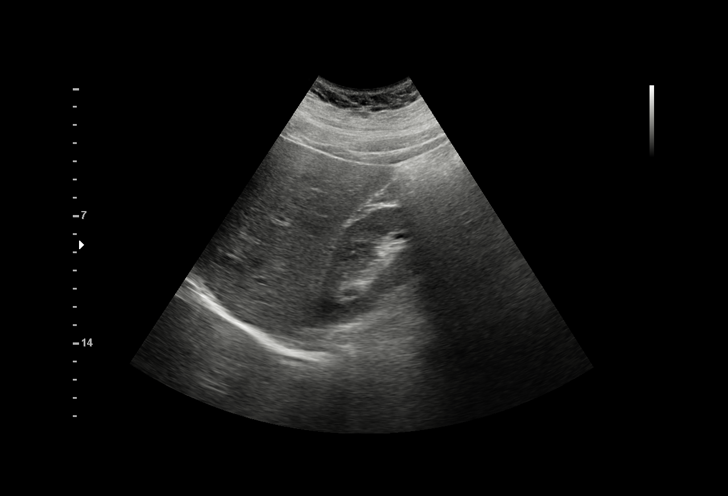
[im 24/48]
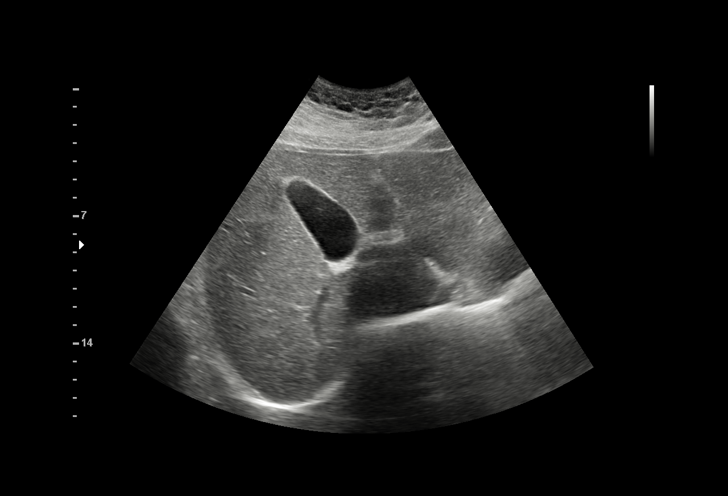
[im 28/48]
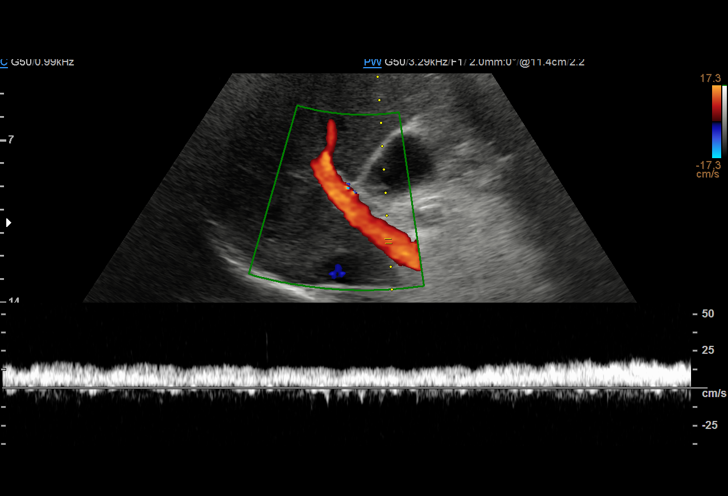
[im 30/48]
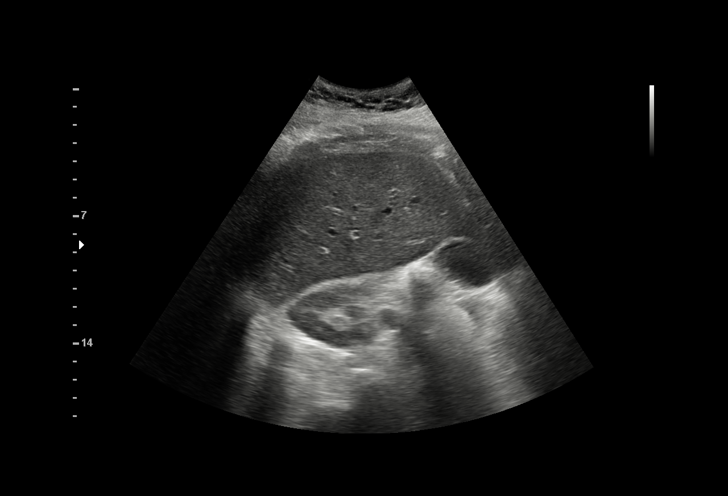
[im 34/48]
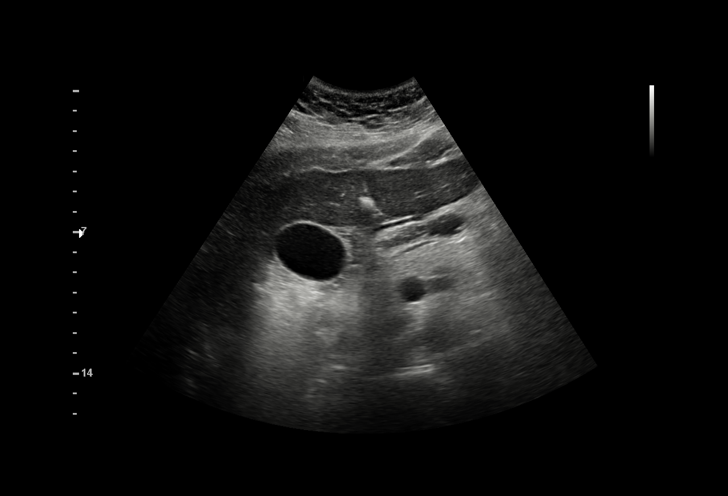
[im 38/48]
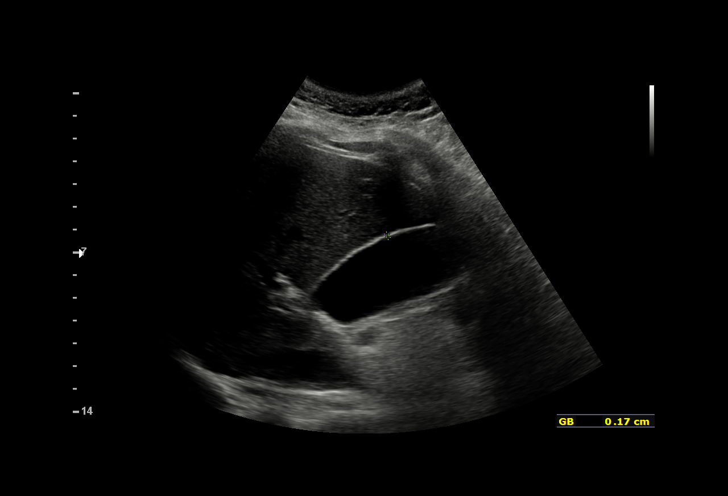
[im 40/48]
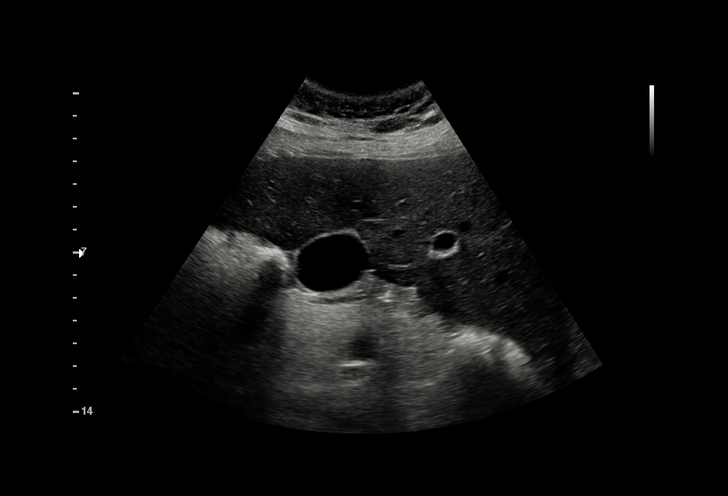
[im 44/48]
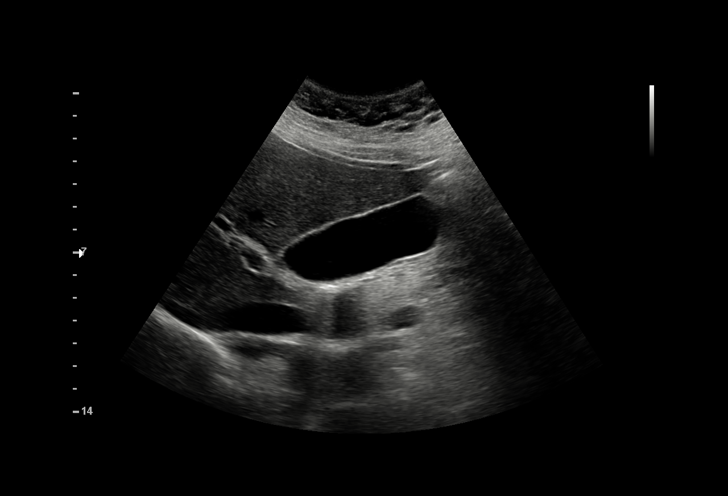
[im 48/48]
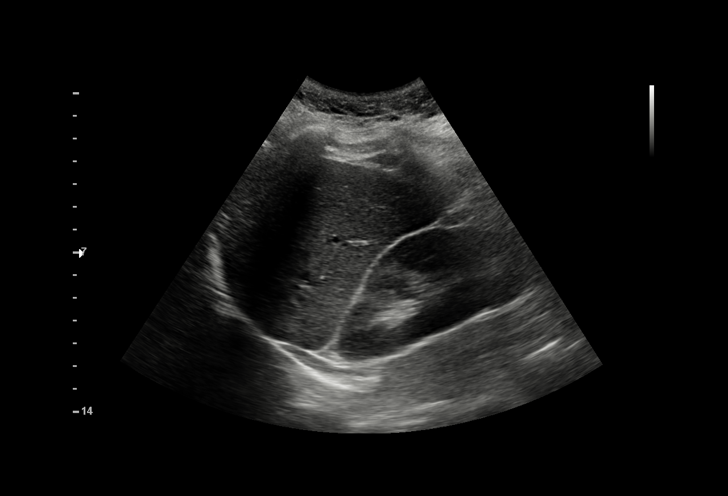

[15 of 25 positions shown; findings below may reference images not displayed]

FINDINGS: Gallbladder:

No gallstones or wall thickening visualized. No sonographic Murphy
sign noted by sonographer.

Common bile duct:

Diameter: Normal caliber, 3 mm

Liver:

No focal lesion identified. Within normal limits in parenchymal
echogenicity. Portal vein is patent on color Doppler imaging with
normal direction of blood flow towards the liver.
IMPRESSION: Normal right upper quadrant ultrasound.

## 2020-07-01 IMAGING — US US MFM OB FOLLOW-UP
1 series · 13 of 28 positions shown · non-contrast
Comparison: none

[Series 1: us mfm ob follow-up · 76 acquisitions, 13 frames shown]
[im 3/76]
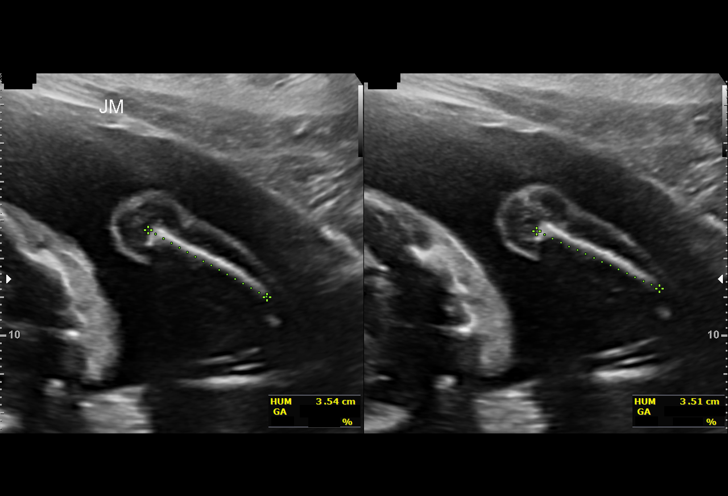
[im 9/76]
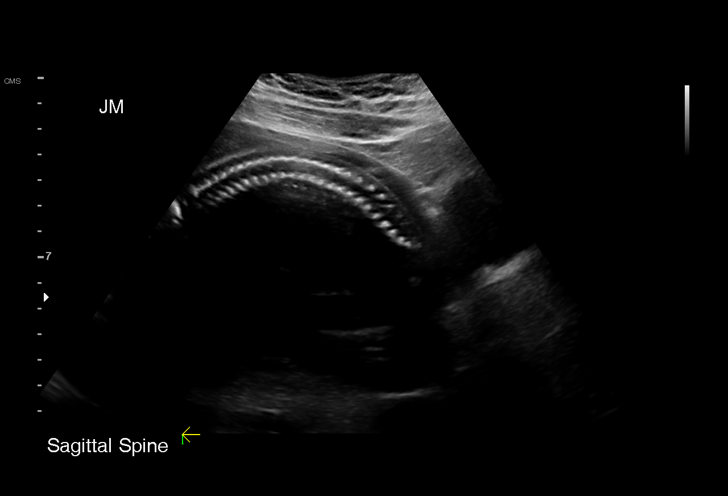
[im 14/76]
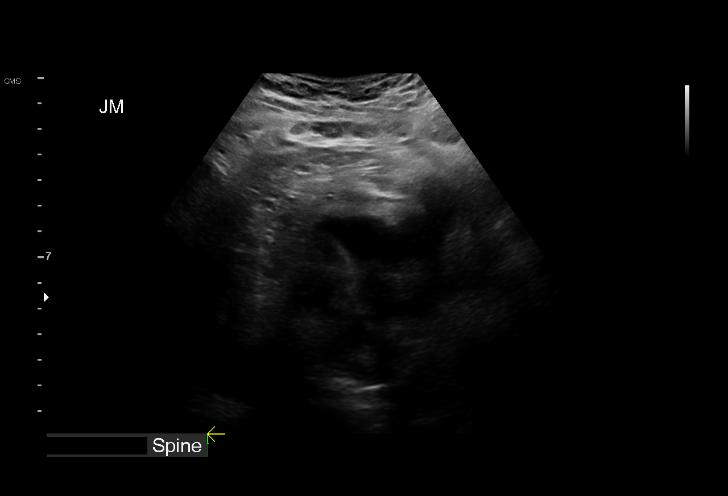
[im 20/76]
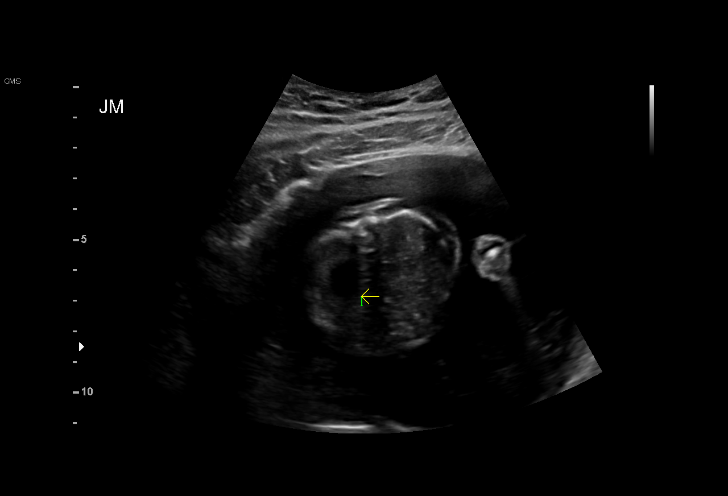
[im 26/76]
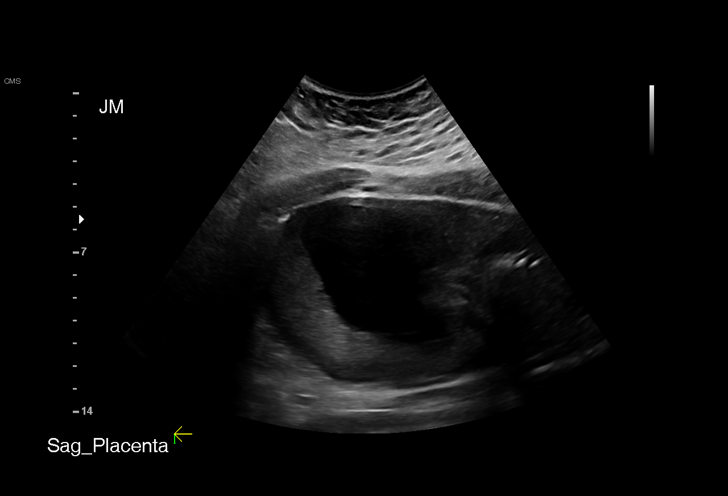
[im 31/76]
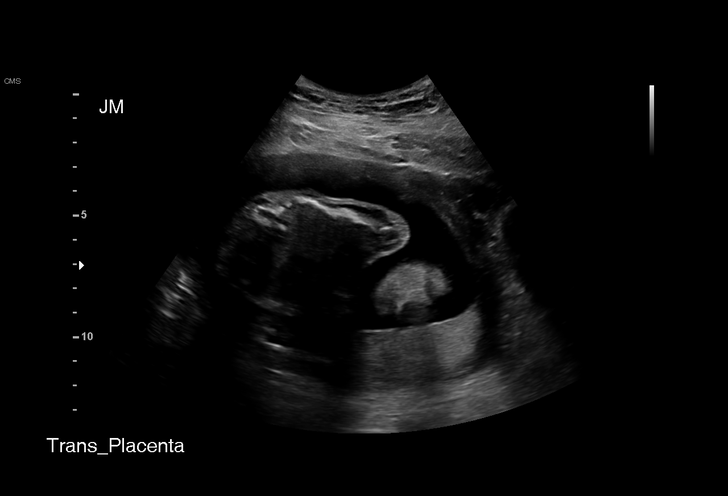
[im 39/76]
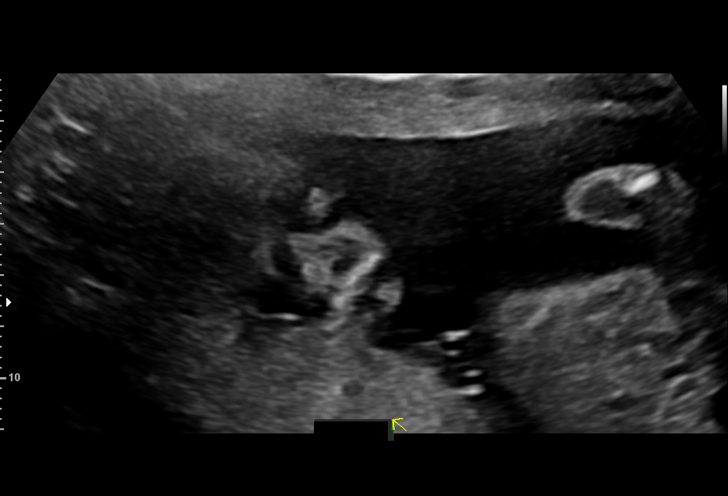
[im 45/76]
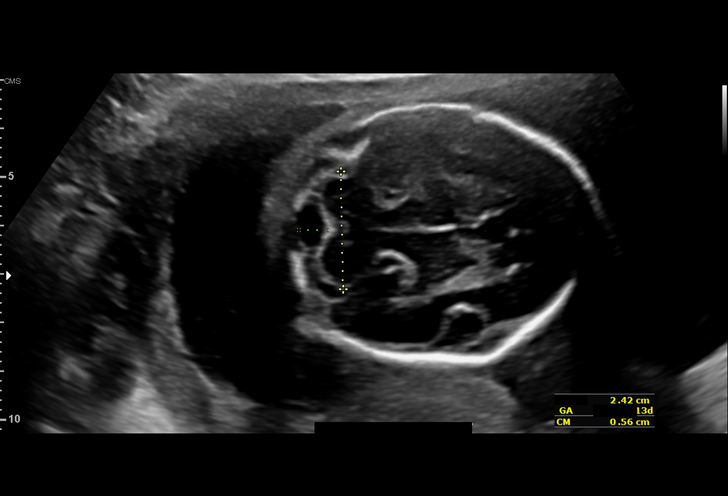
[im 51/76]
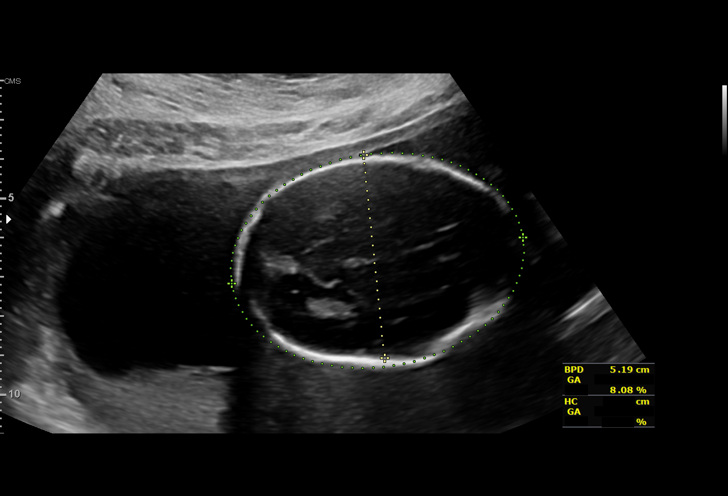
[im 56/76]
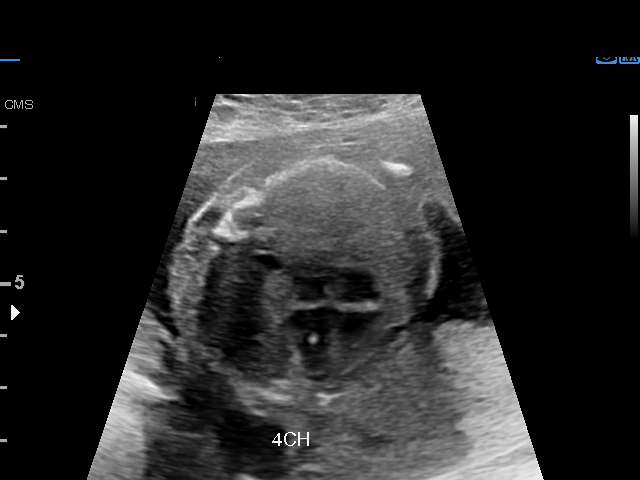
[im 62/76]
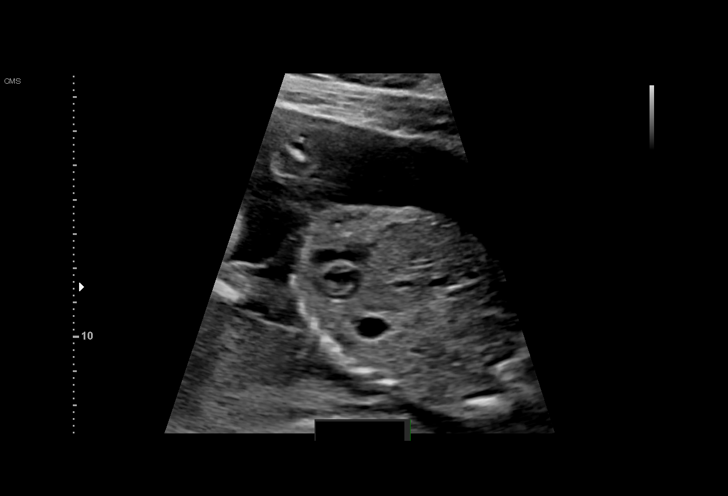
[im 67/76]
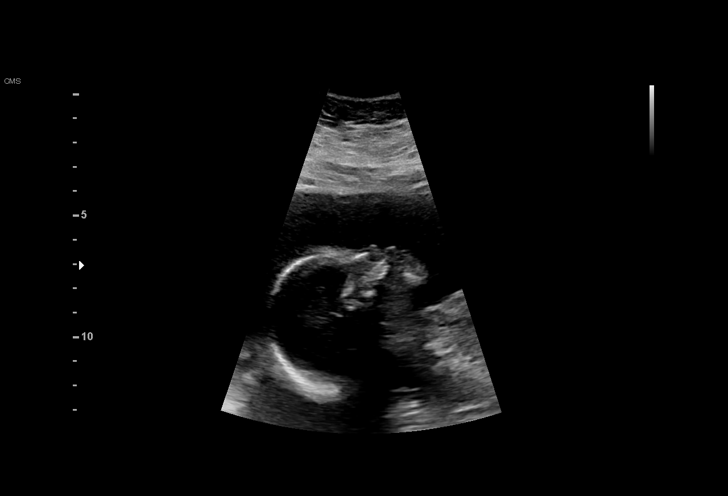
[im 73/76]
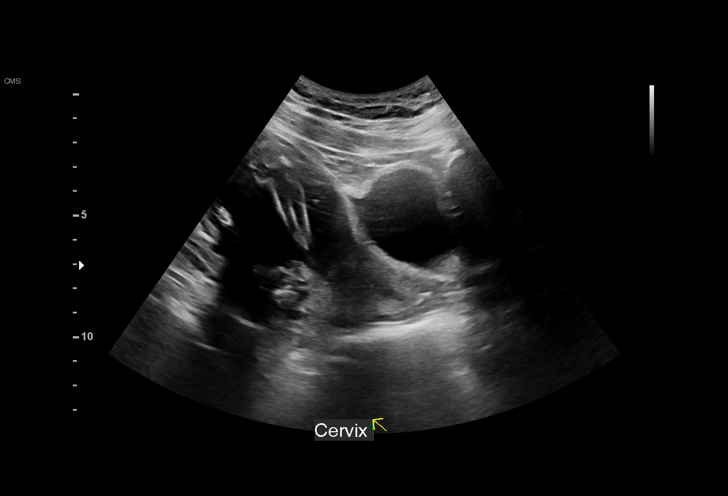

[13 of 28 positions shown; findings below may reference images not displayed]

----------------------------------------------------------------------

 ----------------------------------------------------------------------
Indications

  Antenatal follow-up for nonvisualized fetal
  anatomy
  Obesity complicating pregnancy, second
  trimester (Pre Pregnancy BMI 39)
  Abnormal fetal ultrasound (EIFLV)
  23 weeks gestation of pregnancy
 ----------------------------------------------------------------------
Vital Signs

                                                Height:        5'8"
Fetal Evaluation

 Num Of Fetuses:          1
 Fetal Heart Rate(bpm):   150
 Cardiac Activity:        Observed
 Presentation:            Breech
 Placenta:                Posterior
 P. Cord Insertion:       Visualized

 Amniotic Fluid
 AFI FV:      Within normal limits

                             Largest Pocket(cm)

Biometry

 BPD:      51.7  mm     G. Age:  21w 5d          7  %    CI:        67.68   %    70 - 86
                                                         FL/HC:       19.0  %    19.2 -
 HC:      201.1  mm     G. Age:  22w 2d         12  %    HC/AC:       1.14       1.05 -
 AC:      175.8  mm     G. Age:  22w 3d         26  %    FL/BPD:      74.1  %    71 - 87
 FL:       38.3  mm     G. Age:  22w 2d         18  %    FL/AC:       21.8  %    20 - 24
 HUM:      36.7  mm     G. Age:  22w 6d         38  %
 CER:      24.1  mm     G. Age:  22w 1d         37  %

 CM:        6.1  mm

 Est. FW:     496   gm     1 lb 1 oz     38  %
OB History

 Gravidity:    1
Gestational Age

 LMP:           23w 0d        Date:  04/14/18                 EDD:   01/19/19
 U/S Today:     22w 1d                                        EDD:   01/25/19
 Best:          23w 0d     Det. By:  LMP  (04/14/18)          EDD:   01/19/19
Anatomy

 Cranium:               Appears normal         LVOT:                   Appears normal
 Cavum:                 Appears normal         Aortic Arch:            Appears normal
 Ventricles:            Appears normal         Ductal Arch:            Appears normal
 Choroid Plexus:        Appears normal         Diaphragm:              Appears normal
 Cerebellum:            Appears normal         Stomach:                Appears normal, left
                                                                       sided
 Posterior Fossa:       Appears normal         Abdomen:                Appears normal
 Nuchal Fold:           Not applicable (>20    Abdominal Wall:         Previously seen
                        wks GA)
 Face:                  Profile nl; orbits     Cord Vessels:           Previously seen
                        prev visualized
 Lips:                  Appears normal         Kidneys:                Appear normal
 Palate:                Not well visualized    Bladder:                Appears normal
 Thoracic:              Appears normal         Spine:                  Appears normal
 Heart:                 Echogenic focus        Upper Extremities:      Previously seen
                        in LV
 RVOT:                  Previously seen        Lower Extremities:      Previously seen

 Other:  Heels previously visualized.  Technically difficult due to maternal
         habitus and fetal position.
Cervix Uterus Adnexa

 Cervix
 Length:            3.3  cm.
 Normal appearance by transabdominal scan.

 Uterus
 No abnormality visualized.

 Left Ovary
 Not visualized.

 Right Ovary
 Not visualized.

 Cul De Sac
 No free fluid seen.

 Adnexa
 No abnormality visualized.
Impression

 Normal interval growth.
 Completed fetal anatomy
Recommendations

 Follow up as clinically indicated.
